# Patient Record
Sex: Male | Born: 1980 | Race: White | Hispanic: No | Marital: Married | Smoking: Current every day smoker
Health system: Southern US, Community
[De-identification: ages and names within clinical notes are randomized; demographics above are authoritative.]

## PROBLEM LIST (undated history)

## (undated) DIAGNOSIS — N200 Calculus of kidney: Secondary | ICD-10-CM

## (undated) HISTORY — PX: ANKLE SURGERY: SHX546

---

## 2001-03-18 ENCOUNTER — Emergency Department (HOSPITAL_COMMUNITY): Admission: EM | Admit: 2001-03-18 | Discharge: 2001-03-18 | Payer: Self-pay | Admitting: Emergency Medicine

## 2001-03-18 ENCOUNTER — Encounter: Payer: Self-pay | Admitting: Emergency Medicine

## 2001-12-14 ENCOUNTER — Emergency Department (HOSPITAL_COMMUNITY): Admission: EM | Admit: 2001-12-14 | Discharge: 2001-12-14 | Payer: Self-pay | Admitting: Emergency Medicine

## 2002-02-26 ENCOUNTER — Emergency Department (HOSPITAL_COMMUNITY): Admission: EM | Admit: 2002-02-26 | Discharge: 2002-02-26 | Payer: Self-pay | Admitting: *Deleted

## 2002-02-26 ENCOUNTER — Encounter: Payer: Self-pay | Admitting: Family Medicine

## 2002-05-01 ENCOUNTER — Encounter: Payer: Self-pay | Admitting: Emergency Medicine

## 2002-05-01 ENCOUNTER — Emergency Department (HOSPITAL_COMMUNITY): Admission: EM | Admit: 2002-05-01 | Discharge: 2002-05-01 | Payer: Self-pay | Admitting: Emergency Medicine

## 2002-08-06 ENCOUNTER — Emergency Department (HOSPITAL_COMMUNITY): Admission: AC | Admit: 2002-08-06 | Discharge: 2002-08-07 | Payer: Self-pay

## 2002-08-06 ENCOUNTER — Encounter: Payer: Self-pay | Admitting: *Deleted

## 2003-10-14 ENCOUNTER — Emergency Department (HOSPITAL_COMMUNITY): Admission: EM | Admit: 2003-10-14 | Discharge: 2003-10-14 | Payer: Self-pay | Admitting: Emergency Medicine

## 2004-02-11 ENCOUNTER — Emergency Department (HOSPITAL_COMMUNITY): Admission: EM | Admit: 2004-02-11 | Discharge: 2004-02-11 | Payer: Self-pay | Admitting: Emergency Medicine

## 2004-07-22 ENCOUNTER — Emergency Department (HOSPITAL_COMMUNITY): Admission: EM | Admit: 2004-07-22 | Discharge: 2004-07-22 | Payer: Self-pay | Admitting: Emergency Medicine

## 2007-01-22 ENCOUNTER — Emergency Department (HOSPITAL_COMMUNITY): Admission: EM | Admit: 2007-01-22 | Discharge: 2007-01-22 | Payer: Self-pay | Admitting: Emergency Medicine

## 2007-05-19 ENCOUNTER — Emergency Department (HOSPITAL_COMMUNITY): Admission: EM | Admit: 2007-05-19 | Discharge: 2007-05-20 | Payer: Self-pay | Admitting: Emergency Medicine

## 2007-09-29 ENCOUNTER — Emergency Department (HOSPITAL_COMMUNITY): Admission: EM | Admit: 2007-09-29 | Discharge: 2007-09-29 | Payer: Self-pay | Admitting: Emergency Medicine

## 2009-02-27 ENCOUNTER — Emergency Department (HOSPITAL_COMMUNITY): Admission: EM | Admit: 2009-02-27 | Discharge: 2009-02-27 | Payer: Self-pay | Admitting: Emergency Medicine

## 2009-03-29 ENCOUNTER — Emergency Department (HOSPITAL_COMMUNITY): Admission: EM | Admit: 2009-03-29 | Discharge: 2009-03-29 | Payer: Self-pay | Admitting: *Deleted

## 2009-04-04 ENCOUNTER — Emergency Department (HOSPITAL_COMMUNITY): Admission: EM | Admit: 2009-04-04 | Discharge: 2009-04-04 | Payer: Self-pay | Admitting: *Deleted

## 2010-04-14 ENCOUNTER — Emergency Department (HOSPITAL_COMMUNITY): Admission: EM | Admit: 2010-04-14 | Discharge: 2010-04-14 | Payer: Self-pay | Admitting: Emergency Medicine

## 2011-02-23 LAB — URINE MICROSCOPIC-ADD ON

## 2011-02-23 LAB — URINALYSIS, ROUTINE W REFLEX MICROSCOPIC
Glucose, UA: NEGATIVE mg/dL
Leukocytes, UA: NEGATIVE
Nitrite: NEGATIVE
Protein, ur: 30 mg/dL — AB
Protein, ur: 30 mg/dL — AB
Specific Gravity, Urine: 1.029 (ref 1.005–1.030)
Urobilinogen, UA: 0.2 mg/dL (ref 0.0–1.0)
Urobilinogen, UA: 1 mg/dL (ref 0.0–1.0)

## 2011-02-23 LAB — POCT I-STAT, CHEM 8
BUN: 19 mg/dL (ref 6–23)
Creatinine, Ser: 1.5 mg/dL (ref 0.4–1.5)
Glucose, Bld: 98 mg/dL (ref 70–99)
Hemoglobin: 16.7 g/dL (ref 13.0–17.0)
TCO2: 25 mmol/L (ref 0–100)

## 2011-02-24 LAB — URINALYSIS, ROUTINE W REFLEX MICROSCOPIC
Nitrite: NEGATIVE
Protein, ur: 100 mg/dL — AB
Specific Gravity, Urine: 1.029 (ref 1.005–1.030)
Urobilinogen, UA: 1 mg/dL (ref 0.0–1.0)

## 2011-02-24 LAB — URINE CULTURE
Colony Count: NO GROWTH
Culture: NO GROWTH

## 2011-08-24 LAB — COMPREHENSIVE METABOLIC PANEL
ALT: 55 — ABNORMAL HIGH
AST: 35
Albumin: 4
Alkaline Phosphatase: 104
CO2: 27
Chloride: 107
Creatinine, Ser: 1.05
GFR calc Af Amer: >60
Potassium: 4
Sodium: 138
Total Bilirubin: 0.6

## 2011-08-24 LAB — URINALYSIS, ROUTINE W REFLEX MICROSCOPIC
Bilirubin Urine: NEGATIVE
Hgb urine dipstick: NEGATIVE
Ketones, ur: NEGATIVE
Specific Gravity, Urine: 1.025
Urobilinogen, UA: 0.2

## 2011-08-24 LAB — CBC
Platelets: 236
RBC: 5.34
WBC: 7.2

## 2011-08-24 LAB — DIFFERENTIAL
Basophils Absolute: 0
Eosinophils Absolute: 0.2
Eosinophils Relative: 3
Lymphocytes Relative: 25
Monocytes Absolute: 0.7

## 2011-08-24 LAB — INFLUENZA A+B VIRUS AG-DIRECT(RAPID)
Inflenza A Ag: NEGATIVE
Influenza B Ag: NEGATIVE

## 2011-08-31 LAB — URINALYSIS, ROUTINE W REFLEX MICROSCOPIC
Nitrite: NEGATIVE
Specific Gravity, Urine: 1.03
Urobilinogen, UA: 0.2

## 2011-08-31 LAB — URINE MICROSCOPIC-ADD ON

## 2011-10-19 ENCOUNTER — Emergency Department (HOSPITAL_COMMUNITY)
Admission: EM | Admit: 2011-10-19 | Discharge: 2011-10-19 | Disposition: A | Discharge status: Home / Self Care | Payer: Self-pay | Attending: Emergency Medicine | Admitting: Emergency Medicine

## 2011-10-19 ENCOUNTER — Emergency Department (HOSPITAL_COMMUNITY): Payer: Self-pay

## 2011-10-19 DIAGNOSIS — F17200 Nicotine dependence, unspecified, uncomplicated: Secondary | ICD-10-CM | POA: Insufficient documentation

## 2011-10-19 DIAGNOSIS — M791 Myalgia, unspecified site: Secondary | ICD-10-CM

## 2011-10-19 DIAGNOSIS — IMO0001 Reserved for inherently not codable concepts without codable children: Secondary | ICD-10-CM | POA: Insufficient documentation

## 2011-10-19 DIAGNOSIS — R062 Wheezing: Secondary | ICD-10-CM | POA: Insufficient documentation

## 2011-10-19 DIAGNOSIS — J40 Bronchitis, not specified as acute or chronic: Secondary | ICD-10-CM | POA: Insufficient documentation

## 2011-10-19 DIAGNOSIS — R112 Nausea with vomiting, unspecified: Secondary | ICD-10-CM | POA: Insufficient documentation

## 2011-10-19 DIAGNOSIS — R509 Fever, unspecified: Secondary | ICD-10-CM | POA: Insufficient documentation

## 2011-10-19 HISTORY — DX: Calculus of kidney: N20.0

## 2011-10-19 MED ORDER — IBUPROFEN 800 MG PO TABS
800.0000 mg | ORAL_TABLET | Freq: Once | ORAL | Status: AC
  Administered 2011-10-19: 800 mg via ORAL
  Filled 2011-10-19: qty 1

## 2011-10-19 MED ORDER — AZITHROMYCIN 250 MG PO TABS: 250.0000 mg | ORAL_TABLET | Freq: Every day | ORAL | Status: AC

## 2011-10-19 MED ORDER — PROMETHAZINE HCL 25 MG PO TABS: 25.0000 mg | ORAL_TABLET | Freq: Four times a day (QID) | ORAL | Status: AC | PRN

## 2011-10-19 MED ORDER — IBUPROFEN 800 MG PO TABS: 800.0000 mg | ORAL_TABLET | Freq: Three times a day (TID) | ORAL | Status: AC

## 2011-10-19 MED ORDER — ONDANSETRON 8 MG PO TBDP
8.0000 mg | ORAL_TABLET | Freq: Once | ORAL | Status: AC
  Administered 2011-10-19: 8 mg via ORAL
  Filled 2011-10-19: qty 1

## 2011-10-19 MED ORDER — PREDNISONE 20 MG PO TABS: 20.0000 mg | ORAL_TABLET | Freq: Every day | ORAL | Status: AC

## 2011-10-19 MED ORDER — ALBUTEROL SULFATE HFA 108 (90 BASE) MCG/ACT IN AERS
2.0000 | INHALATION_SPRAY | Freq: Once | RESPIRATORY_TRACT | Status: AC
  Administered 2011-10-19: 2 via RESPIRATORY_TRACT
  Filled 2011-10-19: qty 6.7

## 2011-10-19 NOTE — ED Notes (Signed)
Spacer given with inhaler.

## 2011-10-19 NOTE — ED Provider Notes (Signed)
History     CSN: [id number] Arrival date & time: 10/19/2011 11:13 AM   First MD Initiated Contact with Patient 10/19/11 1149      Chief Complaint  Patient presents with  . Fever  . Weakness  . Sore Throat  . Nausea  . Emesis   Patient is a 30 y.o. male presenting with fever, weakness, pharyngitis, and vomiting.  Fever Primary symptoms of the febrile illness include fever and vomiting. Primary symptoms do not include fatigue, visual change, headaches, cough, wheezing, shortness of breath, abdominal pain, nausea, dysuria, altered mental status, arthralgias or rash.  Weakness The primary symptoms include fever and vomiting. Primary symptoms do not include headaches, altered mental status, visual change or nausea.  Additional symptoms include weakness. Additional symptoms do not include photophobia.  Sore Throat Associated symptoms include a fever, vomiting and weakness. Pertinent negatives include no abdominal pain, arthralgias, chest pain, chills, coughing, diaphoresis, fatigue, headaches, nausea, neck pain, numbness, rash or visual change.  Emesis  Associated symptoms include a fever. Pertinent negatives include no abdominal pain, no arthralgias, no chills, no cough and no headaches.   Patient presents to the emergency room with complaint of fever, sore throat, cough, and generalized body aches for the past three days. Patient denies any other complaints. Patient reports no difficulty swallowing.   Past Medical History  Diagnosis Date  . Kidney stones     Past Surgical History  Procedure Date  . Ankle surgery     History reviewed. No pertinent family history.  History  Substance Use Topics  . Smoking status: Current Everyday Smoker -- 1.0 packs/day  . Smokeless tobacco: Not on file  . Alcohol Use: No      Review of Systems  Constitutional: Positive for fever. Negative for chills, diaphoresis, appetite change and fatigue.  HENT: Negative for neck pain.   Eyes:  Negative for photophobia and visual disturbance.  Respiratory: Negative for cough, chest tightness, shortness of breath and wheezing.   Cardiovascular: Negative for chest pain.  Gastrointestinal: Positive for vomiting. Negative for nausea and abdominal pain.  Genitourinary: Negative for dysuria and flank pain.  Musculoskeletal: Negative for back pain and arthralgias.  Skin: Negative for rash.  Neurological: Positive for weakness. Negative for numbness and headaches.  Psychiatric/Behavioral: Negative for altered mental status.  All other systems reviewed and are negative.    Allergies  Review of patient's allergies indicates no known allergies.  Home Medications   Current Outpatient Rx  Name Route Sig Dispense Refill  . IBUPROFEN 200 MG PO TABS Oral Take 200 mg by mouth every 6 (six) hours as needed. PAIN     . PSEUDOEPH-DOXYLAMINE-DM-APAP 30-6.25-15-325 MG PO CAPS Oral Take 2 capsules by mouth at bedtime as needed. FOR COLD SYMPTOMS       BP 117/68  Pulse 83  Temp(Src) 99.3 F (37.4 C) (Oral)  Resp 16  SpO2 93%  Physical Exam  Nursing note and vitals reviewed. Constitutional: He is oriented to person, place, and time. He appears well-developed and well-nourished. No distress.  HENT:  Head: Normocephalic and atraumatic.  Eyes: Conjunctivae and EOM are normal. Pupils are equal, round, and reactive to light. Right eye exhibits no discharge. Left eye exhibits no discharge. No scleral icterus.  Neck: Normal range of motion. Neck supple. No JVD present. No tracheal deviation present. No thyromegaly present.  Cardiovascular: Normal rate and regular rhythm.   Pulmonary/Chest: Effort normal. No stridor. No respiratory distress. He has wheezes. He has no rales. He exhibits  no tenderness.  Abdominal: Soft. Bowel sounds are normal. He exhibits no distension and no mass. There is no tenderness. There is no guarding.  Musculoskeletal: Normal range of motion.  Lymphadenopathy:    He has  no cervical adenopathy.  Neurological: He is alert and oriented to person, place, and time.  Skin: Skin is warm and dry. No rash noted. He is not diaphoretic. No erythema. No pallor.  Psychiatric: He has a normal mood and affect. His behavior is normal. Thought content normal.    ED Course  Procedures (including critical care time)  Patient seen and evaluated.  VSS reviewed. . Nursing notes reviewed.  Initial testing ordered. Will monitor the patient closely. They agree with the treatment plan and diagnosis.   Dg Chest 2 View  10/19/2011  *RADIOLOGY REPORT*  Clinical Data: Fever and cough.  CHEST - 2 VIEW  Comparison: Two-view chest 09/29/2007.  Findings: The heart size is normal.  The lungs are clear.  The visualized soft tissues and bony thorax are unremarkable.  IMPRESSION: Negative chest.  Original Report Authenticated By: Jamesetta Orleans. MATTERN, M.D.   Patient seen and re-evaluated. Resting comfortably. VSS stable. NAD. Patient notified of testing results. Stated agreement and understanding. Patient stated understanding to treatment plan and diagnosis.   1:27 PM patient po fluid challenge without problems.     MDM  Bronchitis will treat patient with antibiotics and inhaler, due to the fact that the patient is a smoker, and has had a change in sputum production. Nausea, vomiting         Demetrius Charity, [place] 10/19/11 1328

## 2011-10-19 NOTE — ED Notes (Signed)
Pt presents with presenting complaint x 2 days- fever at home 103.5 oral- unable to eat and drink- OTC meds for relief

## 2011-10-19 NOTE — ED Provider Notes (Signed)
Medical screening examination/treatment/procedure(s) were performed by non-physician practitioner and as supervising physician I was immediately available for consultation/collaboration.  Flint Melter, MD 10/19/11 2041

## 2012-01-19 ENCOUNTER — Encounter (HOSPITAL_COMMUNITY): Payer: Self-pay

## 2012-01-19 ENCOUNTER — Emergency Department (INDEPENDENT_AMBULATORY_CARE_PROVIDER_SITE_OTHER)
Admission: EM | Admit: 2012-01-19 | Discharge: 2012-01-19 | Disposition: A | Discharge status: Home / Self Care | Payer: BC Managed Care – PPO | Source: Home / Self Care

## 2012-01-19 DIAGNOSIS — K648 Other hemorrhoids: Secondary | ICD-10-CM

## 2012-01-19 MED ORDER — HYDROCORTISONE ACETATE 25 MG RE SUPP: 25.0000 mg | Freq: Two times a day (BID) | RECTAL | Status: AC

## 2012-01-19 NOTE — ED Notes (Signed)
Reports painless rectal bleeding for past 8-9 months, getting progressively worse

## 2012-01-19 NOTE — ED Provider Notes (Signed)
History     CSN: [id number]  Arrival date & time 01/19/12  1222   None     Chief Complaint  Patient presents with  . Rectal Bleeding    (Consider location/radiation/quality/duration/timing/severity/associated sxs/prior treatment) HPI Comments: Pt presents with c/o rectal bleeding intermittently x 8-9 months. He states that he notices bright red blood in the toilet bowel after a BM usually twice a week. BMs are regular, and soft normal stool. He denies rectal pain, nor straining with BMs. No FH of colon or rectal problems.     Past Medical History  Diagnosis Date  . Kidney stones     Past Surgical History  Procedure Date  . Ankle surgery     History reviewed. No pertinent family history.  History  Substance Use Topics  . Smoking status: Current Everyday Smoker -- 1.0 packs/day  . Smokeless tobacco: Not on file  . Alcohol Use: No      Review of Systems  Constitutional: Negative for fever, chills and unexpected weight change.  Gastrointestinal: Negative for nausea, vomiting, abdominal pain, diarrhea, constipation and rectal pain.    Allergies  Review of patient's allergies indicates no known allergies.  Home Medications   Current Outpatient Rx  Name Route Sig Dispense Refill  . HYDROCORTISONE ACETATE 25 MG RE SUPP Rectal Place 1 suppository (25 mg total) rectally 2 (two) times daily. 14 suppository 0  . IBUPROFEN 200 MG PO TABS Oral Take 200 mg by mouth every 6 (six) hours as needed. PAIN       BP 104/62  Pulse 58  Temp(Src) 98 F (36.7 C) (Oral)  SpO2 100%  Physical Exam  Nursing note and vitals reviewed. Constitutional: He appears well-developed and well-nourished. No distress.  HENT:  Head: Normocephalic and atraumatic.  Cardiovascular: Normal rate, regular rhythm and normal heart sounds.   Pulmonary/Chest: Effort normal and breath sounds normal.  Abdominal: Soft. Bowel sounds are normal. He exhibits no distension and no mass. There is no tenderness.   Genitourinary: Rectal exam shows internal hemorrhoid. Rectal exam shows no external hemorrhoid (external hemorrhoid tag noted), no fissure, no mass, no tenderness and anal tone normal. Guaiac positive stool. Prostate is not enlarged and not tender.       Anoscope exam reveals one small bleeding internal hemorrhoid.  Neurological: He is alert.  Skin: Skin is warm and dry.  Psychiatric: He has a normal mood and affect.    ED Course  Procedures (including critical care time)  Labs Reviewed - No data to display No results found.   1. Internal hemorrhoid, bleeding       MDM          Melody Comas, PA 01/19/12 1456

## 2012-01-19 NOTE — Discharge Instructions (Signed)
Increase your dietary fiber (eat more fruits, vegetables, and high fiber foods) and drink 6-8 glasses of water a day. Avoid straining with bowel movements and prolonged sitting on the toilet. If your rectal bleeding continues to be recurrent, call Dr Lamar Sprinkles office for follow up.

## 2012-01-24 NOTE — ED Provider Notes (Signed)
Medical screening examination/treatment/procedure(s) were performed by non-physician practitioner and as supervising physician I was immediately available for consultation/collaboration.  Alen Bleacher, MD 01/24/12 2128

## 2013-12-17 ENCOUNTER — Emergency Department (HOSPITAL_COMMUNITY): Payer: BC Managed Care – PPO

## 2013-12-17 ENCOUNTER — Encounter (HOSPITAL_COMMUNITY): Payer: Self-pay | Admitting: Emergency Medicine

## 2013-12-17 ENCOUNTER — Emergency Department (HOSPITAL_COMMUNITY)
Admission: EM | Admit: 2013-12-17 | Discharge: 2013-12-17 | Disposition: A | Discharge status: Home / Self Care | Payer: BC Managed Care – PPO | Attending: Emergency Medicine | Admitting: Emergency Medicine

## 2013-12-17 DIAGNOSIS — S60229A Contusion of unspecified hand, initial encounter: Secondary | ICD-10-CM

## 2013-12-17 DIAGNOSIS — Z87442 Personal history of urinary calculi: Secondary | ICD-10-CM | POA: Insufficient documentation

## 2013-12-17 DIAGNOSIS — F17200 Nicotine dependence, unspecified, uncomplicated: Secondary | ICD-10-CM | POA: Insufficient documentation

## 2013-12-17 DIAGNOSIS — W208XXA Other cause of strike by thrown, projected or falling object, initial encounter: Secondary | ICD-10-CM | POA: Insufficient documentation

## 2013-12-17 DIAGNOSIS — Y9389 Activity, other specified: Secondary | ICD-10-CM | POA: Insufficient documentation

## 2013-12-17 DIAGNOSIS — Z791 Long term (current) use of non-steroidal anti-inflammatories (NSAID): Secondary | ICD-10-CM | POA: Insufficient documentation

## 2013-12-17 DIAGNOSIS — Y929 Unspecified place or not applicable: Secondary | ICD-10-CM | POA: Insufficient documentation

## 2013-12-17 MED ORDER — IBUPROFEN 800 MG PO TABS: 800.0000 mg | ORAL_TABLET | Freq: Three times a day (TID) | ORAL | Status: DC | PRN

## 2013-12-17 MED ORDER — IBUPROFEN 800 MG PO TABS
800.0000 mg | ORAL_TABLET | Freq: Once | ORAL | Status: AC
  Administered 2013-12-17: 800 mg via ORAL
  Filled 2013-12-17: qty 1

## 2013-12-17 NOTE — ED Provider Notes (Signed)
Medical screening examination/treatment/procedure(s) were performed by non-physician practitioner and as supervising physician I was immediately available for consultation/collaboration.  EKG Interpretation   None         Taylan Mayhan S Tamika Nou, MD 12/17/13 1548 

## 2013-12-17 NOTE — ED Provider Notes (Signed)
CSN: [id number]     Arrival date & time 12/17/13  1028 History   First MD Initiated Contact with Patient 12/17/13 1054     Chief Complaint  Patient presents with  . Hand Pain   (Consider location/radiation/quality/duration/timing/severity/associated sxs/prior Treatment) The history is provided by the patient.   Patient presents with left fourth MCP pain. Patient states that 2 days ago he was putting together a swingset in his backyard and one of the poles slipped, falling on his left hand. States that he has taken naproxen 4 at which helped the pain go from a 9/10 intensity to a 7/10 intensity. States it is very sore and the fourth finger is tingly. Denies any other injury. Denies any elbow or wrist pain, weakness or numbness of the fingers. Past Medical History  Diagnosis Date  . Kidney stones    Past Surgical History  Procedure Laterality Date  . Ankle surgery     No family history on file. History  Substance Use Topics  . Smoking status: Current Every Day Smoker -- 1.00 packs/day  . Smokeless tobacco: Not on file  . Alcohol Use: No    Review of Systems  Constitutional: Negative for fever.  Musculoskeletal: Positive for arthralgias.  Skin: Negative for color change, pallor and wound.  Allergic/Immunologic: Negative for immunocompromised state.  Neurological: Negative for weakness and numbness.  Hematological: Does not bruise/bleed easily.    Allergies  Review of patient's allergies indicates no known allergies.  Home Medications   Current Outpatient Rx  Name  Route  Sig  Dispense  Refill  . naproxen sodium (ANAPROX) 220 MG tablet   Oral   Take 440 mg by mouth 2 (two) times daily with a meal.          BP 132/74  Pulse 71  Temp(Src) 97.8 F (36.6 C) (Oral)  Resp 16  SpO2 99% Physical Exam  Nursing note and vitals reviewed. Constitutional: He appears well-developed and well-nourished. No distress.  HENT:  Head: Normocephalic and atraumatic.  Neck: Neck  supple.  Pulmonary/Chest: Effort normal.  Musculoskeletal:       Left wrist: Normal.       Left forearm: Normal.       Left hand: He exhibits decreased range of motion, tenderness and bony tenderness. He exhibits normal capillary refill, no deformity, no laceration and no swelling. Normal sensation noted.       Hands: Patient with slightly decreased range of motion of the left fourth MCP. Otherwise, patient has full active range of motion of all fingers of the left hand  Neurological: He is alert.  Skin: He is not diaphoretic.    ED Course  Procedures (including critical care time) Labs Review Labs Reviewed - No data to display Imaging Review Dg Hand Complete Left  12/17/2013   CLINICAL DATA:  Left fourth MCP tenderness  EXAM: LEFT HAND - COMPLETE 3+ VIEW  COMPARISON:  None.  FINDINGS: There is no evidence of fracture or dislocation. There is no evidence of arthropathy or other focal bone abnormality. Soft tissues are unremarkable.  IMPRESSION: Negative.   Electronically Signed   By: Elige KoHetal  Patel   On: 12/17/2013 11:26    EKG Interpretation   None       MDM   1. Hand contusion     Patient presents with pain to the left fourth MCP after swingset fell on it 2 days ago. He is neurovascularly intact. Xray negative.  Pt d/c home with NSAIDs, RICE treatment. PCP  follow up.  Discussed result, findings, treatment, and follow up  with patient.  Pt given return precautions.  Pt verbalizes understanding and agrees with plan.        Trixie Dredge, PA-C 12/17/13 1257

## 2013-12-17 NOTE — ED Notes (Addendum)
Pt reports putting child toy together and metal part hit him in right joint of the 4th ring finger on left hand on Saturday. Pain at present 7/10. bil strong radial pulses. Able to wiggle fingers on left hand slightly.

## 2013-12-17 NOTE — Discharge Instructions (Signed)
Read the information below.  Use the prescribed medication as directed.  Please discuss all new medications with your pharmacist.  You may return to the Emergency Department at any time for worsening condition or any new symptoms that concern you.  If you develop uncontrolled pain, weakness or numbness of the extremity, severe discoloration of the skin, or you are unable to use your hand, return to the ER for a recheck.     Hand Contusion A hand contusion is a deep bruise on your hand area. Contusions are the result of an injury that caused bleeding under the skin. The contusion may turn blue, purple, or yellow. Minor injuries will give you a painless contusion, but more severe contusions may stay painful and swollen for a few weeks. CAUSES  A contusion is usually caused by a blow, trauma, or direct force to an area of the body. SYMPTOMS   Swelling and redness of the injured area.  Discoloration of the injured area.  Tenderness and soreness of the injured area.  Pain. DIAGNOSIS  The diagnosis can be made by taking a history and performing a physical exam. An X-ray, CT scan, or MRI may be needed to determine if there were any associated injuries, such as broken bones (fractures). TREATMENT  Often, the best treatment for a hand contusion is resting, elevating, icing, and applying cold compresses to the injured area. Over-the-counter medicines may also be recommended for pain control. HOME CARE INSTRUCTIONS   Put ice on the injured area.  Put ice in a plastic bag.  Place a towel between your skin and the bag.  Leave the ice on for 15-20 minutes, 03-04 times a day.  Only take over-the-counter or prescription medicines as directed by your caregiver. Your caregiver may recommend avoiding anti-inflammatory medicines (aspirin, ibuprofen, and naproxen) for 48 hours because these medicines may increase bruising.  If told, use an elastic wrap as directed. This can help reduce swelling. You may  remove the wrap for sleeping, showering, and bathing. If your fingers become numb, cold, or blue, take the wrap off and reapply it more loosely.  Elevate your hand with pillows to reduce swelling.  Avoid overusing your hand if it is painful. SEEK IMMEDIATE MEDICAL CARE IF:   You have increased redness, swelling, or pain in your hand.  Your swelling or pain is not relieved with medicines.  You have loss of feeling in your hand or are unable to move your fingers.  Your hand turns cold or blue.  You have pain when you move your fingers.  Your hand becomes warm to the touch.  Your contusion does not improve in 2 days. MAKE SURE YOU:   Understand these instructions.  Will watch your condition.  Will get help right away if you are not doing well or get worse. Document Released: 04/23/2002 Document Revised: 07/26/2012 Document Reviewed: 04/24/2012 [facility] [place] [facility] [employer] Patient Information 2014 [place], [place].

## 2014-04-19 ENCOUNTER — Emergency Department (HOSPITAL_COMMUNITY)
Admission: EM | Admit: 2014-04-19 | Discharge: 2014-04-19 | Disposition: A | Discharge status: Home / Self Care | Payer: BC Managed Care – PPO | Attending: Emergency Medicine | Admitting: Emergency Medicine

## 2014-04-19 ENCOUNTER — Emergency Department (HOSPITAL_COMMUNITY): Payer: BC Managed Care – PPO

## 2014-04-19 ENCOUNTER — Encounter (HOSPITAL_COMMUNITY): Payer: Self-pay | Admitting: Emergency Medicine

## 2014-04-19 DIAGNOSIS — J029 Acute pharyngitis, unspecified: Secondary | ICD-10-CM | POA: Insufficient documentation

## 2014-04-19 DIAGNOSIS — B9789 Other viral agents as the cause of diseases classified elsewhere: Secondary | ICD-10-CM | POA: Insufficient documentation

## 2014-04-19 DIAGNOSIS — R197 Diarrhea, unspecified: Secondary | ICD-10-CM | POA: Insufficient documentation

## 2014-04-19 DIAGNOSIS — Z87442 Personal history of urinary calculi: Secondary | ICD-10-CM | POA: Insufficient documentation

## 2014-04-19 DIAGNOSIS — B349 Viral infection, unspecified: Secondary | ICD-10-CM

## 2014-04-19 DIAGNOSIS — F17200 Nicotine dependence, unspecified, uncomplicated: Secondary | ICD-10-CM | POA: Insufficient documentation

## 2014-04-19 DIAGNOSIS — R112 Nausea with vomiting, unspecified: Secondary | ICD-10-CM | POA: Insufficient documentation

## 2014-04-19 LAB — I-STAT CHEM 8, ED
BUN: 10 mg/dL (ref 6–23)
CALCIUM ION: 1.12 mmol/L (ref 1.12–1.23)
Chloride: 99 mEq/L (ref 96–112)
Creatinine, Ser: 1 mg/dL (ref 0.50–1.35)
GLUCOSE: 107 mg/dL — AB (ref 70–99)
HEMATOCRIT: 48 % (ref 39.0–52.0)
HEMOGLOBIN: 16.3 g/dL (ref 13.0–17.0)
POTASSIUM: 3.8 meq/L (ref 3.7–5.3)
Sodium: 139 mEq/L (ref 137–147)
TCO2: 23 mmol/L (ref 0–100)

## 2014-04-19 MED ORDER — ONDANSETRON 4 MG PO TBDP
4.0000 mg | ORAL_TABLET | Freq: Once | ORAL | Status: AC
  Administered 2014-04-19: 4 mg via ORAL
  Filled 2014-04-19: qty 1

## 2014-04-19 MED ORDER — BENZONATATE 100 MG PO CAPS: 100.0000 mg | ORAL_CAPSULE | Freq: Three times a day (TID) | ORAL | Status: DC | PRN

## 2014-04-19 MED ORDER — ONDANSETRON 4 MG PO TBDP: ORAL_TABLET | ORAL | Status: DC

## 2014-04-19 MED ORDER — ACETAMINOPHEN 500 MG PO TABS
1000.0000 mg | ORAL_TABLET | Freq: Once | ORAL | Status: AC
  Administered 2014-04-19: 1000 mg via ORAL
  Filled 2014-04-19: qty 2

## 2014-04-19 NOTE — ED Provider Notes (Signed)
CSN: [id number]     Arrival date & time 04/19/14  0803 History   First MD Initiated Contact with Patient 04/19/14 0820     Chief Complaint  Patient presents with  . cough with dizziness    . Shortness of Breath  . Emesis  . Sore Throat     (Consider location/radiation/quality/duration/timing/severity/associated sxs/prior Treatment) HPI 33 year old male presents with 3 days of sore throat and cough. He states that he developed nausea, vomiting, and diarrhea over the last couple days. He was at work and vomited and they sent him home. So he came to the emergency department for evaluation. He denies any abdominal pain. He does get chest pain when coughing but otherwise has no chest pain. He occasionally gets dizzy while he is acutely coughing but otherwise has no dizziness at rest or with standing. Developed a fever up to 100.3 last night. He's tried NyQuil without any relief. Denies any neck stiffness or headaches. Feels like his chest is congested but denies any rhinorrhea or nasal congestion. No abdominal pain. States he feels shortness of breath while in active coughing but otherwise not short of breath. He does smoke cigarettes.  Past Medical History  Diagnosis Date  . Kidney stones    Past Surgical History  Procedure Laterality Date  . Ankle surgery     History reviewed. No pertinent family history. History  Substance Use Topics  . Smoking status: Current Every Day Smoker -- 1.00 packs/day  . Smokeless tobacco: Not on file  . Alcohol Use: No    Review of Systems  Constitutional: Positive for fever.  HENT: Positive for congestion, rhinorrhea and sore throat.   Respiratory: Positive for cough and shortness of breath.   Cardiovascular: Positive for chest pain.  Gastrointestinal: Positive for nausea, vomiting and diarrhea. Negative for abdominal pain and blood in stool.  Musculoskeletal: Negative for neck stiffness.  Neurological: Negative for headaches.  All other systems  reviewed and are negative.     Allergies  Review of patient's allergies indicates no known allergies.  Home Medications   Prior to Admission medications   Medication Sig Start Date End Date Taking? Authorizing Provider  naproxen sodium (ANAPROX) 220 MG tablet Take 220 mg by mouth as needed (for pain).   Yes Historical Provider, MD  Pseudoeph-Doxylamine-DM-APAP (NYQUIL PO) Take 15-30 mLs by mouth at bedtime as needed (cold symptoms).   Yes Historical Provider, MD  Pseudoephedrine-APAP-DM (DAYQUIL PO) Take 2 capsules by mouth every 6 (six) hours as needed (cold symptoms).   Yes Historical Provider, MD   BP 117/74  Pulse 95  Temp(Src) 100 F (37.8 C) (Oral)  Resp 16  SpO2 96% Physical Exam  Nursing note and vitals reviewed. Constitutional: He is oriented to person, place, and time. He appears well-developed and well-nourished.  HENT:  Head: Normocephalic and atraumatic.  Right Ear: External ear normal.  Left Ear: External ear normal.  Nose: Nose normal.  Mouth/Throat: Uvula is midline and mucous membranes are normal. No trismus in the jaw. Posterior oropharyngeal erythema present. No oropharyngeal exudate, posterior oropharyngeal edema or tonsillar abscesses.  Eyes: Right eye exhibits no discharge. Left eye exhibits no discharge.  Neck: Normal range of motion. Neck supple.  Cardiovascular: Normal rate, regular rhythm, normal heart sounds and intact distal pulses.   Pulmonary/Chest: Effort normal and breath sounds normal. He has no rales.  Occasional faint wheezing on left side initially, then lungs cleared  Abdominal: Soft. He exhibits no distension. There is no tenderness.  Musculoskeletal: He  exhibits no edema.  Lymphadenopathy:    He has no cervical adenopathy.  Neurological: He is alert and oriented to person, place, and time.  Skin: Skin is warm and dry.    ED Course  Procedures (including critical care time) Labs Review Labs Reviewed  I-STAT CHEM 8, ED - Abnormal;  Notable for the following:    Glucose, Bld 107 (*)    All other components within normal limits    Imaging Review Dg Chest 2 View  04/19/2014   CLINICAL DATA:  Shortness of breath and cough  EXAM: CHEST  2 VIEW  COMPARISON:  10/19/2011  FINDINGS: The heart size and mediastinal contours are within normal limits. Both lungs are clear. The visualized skeletal structures are unremarkable.  IMPRESSION: No active cardiopulmonary disease.   Electronically Signed   By: Alcide CleverMark  Lukens M.D.   On: 04/19/2014 09:06     EKG Interpretation   Date/Time:  Friday April 19 2014 08:23:39 EDT Ventricular Rate:  86 PR Interval:  156 QRS Duration: 91 QT Interval:  355 QTC Calculation: 425 R Axis:   97 Text Interpretation:  Sinus rhythm Borderline right axis deviation  Baseline wander in lead(s) V2 Otherwise within normal limits No old  tracing to compare Confirmed by Treysen Sudbeck  MD, Kiya Eno ([id number]) on 04/19/2014  8:28:06 AM      MDM   Final diagnoses:  Viral syndrome    Patient's chest pain appears to be related to cough, has benign EKG and in setting of infection this unlikely ACS, PE or dissection. No abdominal tenderness. Chest xray shows no pneumonia or other concerning findings. . Sore throat is likely a part of his viral syndrome, has 0 points on Centor criteria, strep testing not indicated. Given zofran, tylenol and tolerated oral fluids. Will d/c with cough suppressant, zofran and NSAIDs/Tylenol prn. Counseled on smoking cessation. Stable for discharge.    Audree CamelScott T Zong Mcquarrie, MD 04/19/14 [phone]

## 2014-04-19 NOTE — Progress Notes (Signed)
[facility] spoke with patient about community resources. Patient stated that he had [facility] and a pcp.

## 2014-04-19 NOTE — ED Notes (Addendum)
Pt reports sore throat/cough over past few days with new onset of CP and SOB with cough starting yesterday. Pt reports episode of vomiting with current nausea at present time.

## 2014-04-19 NOTE — ED Notes (Addendum)
Pt reports non productive cough and sore throat x3 days, 6/10, in last two days pt has had vomiting and diarrhea. Now chest pain when he coughs and dizziness. Reports SOB started yesterday. Pt reports fever yesterday 100.3. Febrile at present.  OTC have provided no relief.

## 2014-04-19 NOTE — ED Notes (Signed)
Pt transported to xray 

## 2015-08-13 ENCOUNTER — Emergency Department (HOSPITAL_COMMUNITY)
Admission: EM | Admit: 2015-08-13 | Discharge: 2015-08-13 | Disposition: A | Discharge status: Home / Self Care | Payer: Commercial Managed Care - HMO | Attending: Emergency Medicine | Admitting: Emergency Medicine

## 2015-08-13 ENCOUNTER — Encounter (HOSPITAL_COMMUNITY): Payer: Self-pay | Admitting: Emergency Medicine

## 2015-08-13 DIAGNOSIS — R0981 Nasal congestion: Secondary | ICD-10-CM | POA: Diagnosis present

## 2015-08-13 DIAGNOSIS — R509 Fever, unspecified: Secondary | ICD-10-CM | POA: Diagnosis not present

## 2015-08-13 DIAGNOSIS — R51 Headache: Secondary | ICD-10-CM | POA: Insufficient documentation

## 2015-08-13 DIAGNOSIS — R6889 Other general symptoms and signs: Secondary | ICD-10-CM

## 2015-08-13 DIAGNOSIS — Z720 Tobacco use: Secondary | ICD-10-CM | POA: Insufficient documentation

## 2015-08-13 DIAGNOSIS — R05 Cough: Secondary | ICD-10-CM | POA: Diagnosis not present

## 2015-08-13 DIAGNOSIS — Z79899 Other long term (current) drug therapy: Secondary | ICD-10-CM | POA: Diagnosis not present

## 2015-08-13 DIAGNOSIS — J029 Acute pharyngitis, unspecified: Secondary | ICD-10-CM | POA: Insufficient documentation

## 2015-08-13 DIAGNOSIS — Z87442 Personal history of urinary calculi: Secondary | ICD-10-CM | POA: Insufficient documentation

## 2015-08-13 LAB — RAPID STREP SCREEN (MED CTR MEBANE ONLY): STREPTOCOCCUS, GROUP A SCREEN (DIRECT): NEGATIVE

## 2015-08-13 MED ORDER — PREDNISONE 20 MG PO TABS: 40.0000 mg | ORAL_TABLET | Freq: Every day | ORAL | Status: DC

## 2015-08-13 MED ORDER — HYDROCODONE-HOMATROPINE 5-1.5 MG/5ML PO SYRP: 5.0000 mL | ORAL_SOLUTION | Freq: Four times a day (QID) | ORAL | Status: DC | PRN

## 2015-08-13 MED ORDER — OSELTAMIVIR PHOSPHATE 75 MG PO CAPS: 75.0000 mg | ORAL_CAPSULE | Freq: Two times a day (BID) | ORAL | Status: DC

## 2015-08-13 MED ORDER — ALBUTEROL SULFATE HFA 108 (90 BASE) MCG/ACT IN AERS: 1.0000 | INHALATION_SPRAY | Freq: Four times a day (QID) | RESPIRATORY_TRACT | Status: DC | PRN

## 2015-08-13 NOTE — Discharge Instructions (Signed)

## 2015-08-13 NOTE — ED Provider Notes (Signed)
CSN: [id number]     Arrival date & time 08/13/15  0911 History   First MD Initiated Contact with Patient 08/13/15 1005     Chief Complaint  Patient presents with  . URI     (Consider location/radiation/quality/duration/timing/severity/associated sxs/prior Treatment) HPI   Chad Martin is a 34 y.o. male who complains of congestion, sore throat, productive cough, myalgias, headache and fever for 2 days. He denies a history of anorexia, chest pain, dizziness and weakness and denies a history of asthma. Patient admits to smoke cigarettes. . Denies DOE, , chest tightness or pressure, radiation to left arm, jaw or back, or diaphoresis. Denies dysuria, flank pain, suprapubic pain, frequency, urgency, or hematuria. Denies headaches, light headedness, weakness, visual disturbances. Denies abdominal pain,diarrhea or constipation.       Past Medical History  Diagnosis Date  . Kidney stones    Past Surgical History  Procedure Laterality Date  . Ankle surgery     No family history on file. Social History  Substance Use Topics  . Smoking status: Current Every Day Smoker -- 1.00 packs/day  . Smokeless tobacco: None  . Alcohol Use: No    Review of Systems  Ten systems reviewed and are negative for acute change, except as noted in the HPI.    Allergies  Review of patient's allergies indicates no known allergies.  Home Medications   Prior to Admission medications   Medication Sig Start Date End Date Taking? Authorizing Provider  ibuprofen (ADVIL,MOTRIN) 200 MG tablet Take 400 mg by mouth every 6 (six) hours as needed for headache, mild pain or moderate pain.   Yes Historical Provider, MD  Multiple Vitamins-Minerals (MULTIVITAMIN ADULT PO) Take 1 tablet by mouth daily.   Yes Historical Provider, MD  Pseudoeph-Doxylamine-DM-APAP (NYQUIL PO) Take 15-30 mLs by mouth at bedtime as needed (cold symptoms).   Yes Historical Provider, MD  albuterol (PROVENTIL HFA;VENTOLIN HFA) 108 (90 BASE)  MCG/ACT inhaler Inhale 1-2 puffs into the lungs every 6 (six) hours as needed for wheezing or shortness of breath. 08/13/15   Calistro Rauf, PA-C  HYDROcodone-homatropine (HYCODAN) 5-1.5 MG/5ML syrup Take 5 mLs by mouth every 6 (six) hours as needed for cough. 08/13/15   Arthor Captain, PA-C  oseltamivir (TAMIFLU) 75 MG capsule Take 1 capsule (75 mg total) by mouth every 12 (twelve) hours. 08/13/15   Arthor Captain, PA-C  predniSONE (DELTASONE) 20 MG tablet Take 2 tablets (40 mg total) by mouth daily. 08/13/15   Letecia Arps, PA-C   BP 101/74 mmHg  Pulse 73  Temp(Src) 97.9 F (36.6 C) (Oral)  Resp 18  SpO2 94% Physical Exam Appears moderately ill but not toxic; temperature as noted in vitals. Ears normal. Eyes:glassy appearance, no discharge  Heart: RRR, NO M/G/R Throat and pharynx erythematous without exudate. Neck supple. Mild Cervical adenopathyhy in the neck.  Sinuses non tender.  The chest with ronchi that clear with cough and mild expiratory wheeze. Abdomen is soft and nontender  ED Course  Procedures (including critical care time) Labs Review Labs Reviewed  RAPID STREP SCREEN (NOT AT [facility])  CULTURE, GROUP A STREP    Imaging Review No results found. I have personally reviewed and evaluated these images and lab results as part of my medical decision-making.   EKG Interpretation None      MDM   Final diagnoses:  Flu-like symptoms    11:32 AM BP 101/74 mmHg  Pulse 73  Temp(Src) 97.9 F (36.6 C) (Oral)  Resp 18  SpO2 94%  Patient with flu like illnes. Tmax 101 at home. D/c with tamiflu and sxs treatment. Work note.    Arthor Captain, PA-C 08/13/15 1132  Pricilla Loveless, MD 08/14/15 [phone]

## 2015-08-13 NOTE — ED Notes (Signed)
Per pt, states cold symptoms since Sunday-cough, sore throat

## 2015-08-15 LAB — CULTURE, GROUP A STREP: STREP A CULTURE: NEGATIVE

## 2019-12-18 ENCOUNTER — Emergency Department (HOSPITAL_COMMUNITY): Payer: Self-pay

## 2019-12-18 ENCOUNTER — Other Ambulatory Visit: Payer: Self-pay

## 2019-12-18 ENCOUNTER — Encounter (HOSPITAL_COMMUNITY): Payer: Self-pay | Admitting: Emergency Medicine

## 2019-12-18 ENCOUNTER — Emergency Department (HOSPITAL_COMMUNITY)
Admission: EM | Admit: 2019-12-18 | Discharge: 2019-12-18 | Disposition: A | Discharge status: Home / Self Care | Payer: Self-pay | Attending: Emergency Medicine | Admitting: Emergency Medicine

## 2019-12-18 DIAGNOSIS — Z87442 Personal history of urinary calculi: Secondary | ICD-10-CM | POA: Insufficient documentation

## 2019-12-18 DIAGNOSIS — M545 Low back pain, unspecified: Secondary | ICD-10-CM

## 2019-12-18 DIAGNOSIS — R1032 Left lower quadrant pain: Secondary | ICD-10-CM | POA: Insufficient documentation

## 2019-12-18 DIAGNOSIS — Z79899 Other long term (current) drug therapy: Secondary | ICD-10-CM | POA: Insufficient documentation

## 2019-12-18 DIAGNOSIS — F17210 Nicotine dependence, cigarettes, uncomplicated: Secondary | ICD-10-CM | POA: Insufficient documentation

## 2019-12-18 LAB — URINALYSIS, ROUTINE W REFLEX MICROSCOPIC
Bacteria, UA: NONE SEEN
Bilirubin Urine: NEGATIVE
Glucose, UA: NEGATIVE mg/dL
Hgb urine dipstick: NEGATIVE
Ketones, ur: NEGATIVE mg/dL
Leukocytes,Ua: NEGATIVE
Nitrite: NEGATIVE
Protein, ur: NEGATIVE mg/dL
Specific Gravity, Urine: 1.014 (ref 1.005–1.030)
pH: 7 (ref 5.0–8.0)

## 2019-12-18 LAB — CBC WITH DIFFERENTIAL/PLATELET
Abs Immature Granulocytes: 0.03 10*3/uL (ref 0.00–0.07)
Basophils Absolute: 0.1 10*3/uL (ref 0.0–0.1)
Basophils Relative: 1 %
Eosinophils Absolute: 0.3 10*3/uL (ref 0.0–0.5)
Eosinophils Relative: 5 %
HCT: 50.4 % (ref 39.0–52.0)
Hemoglobin: 17.3 g/dL — ABNORMAL HIGH (ref 13.0–17.0)
Immature Granulocytes: 0 %
Lymphocytes Relative: 37 %
Lymphs Abs: 2.8 10*3/uL (ref 0.7–4.0)
MCH: 30.7 pg (ref 26.0–34.0)
MCHC: 34.3 g/dL (ref 30.0–36.0)
MCV: 89.4 fL (ref 80.0–100.0)
Monocytes Absolute: 0.4 10*3/uL (ref 0.1–1.0)
Monocytes Relative: 5 %
Neutro Abs: 3.8 10*3/uL (ref 1.7–7.7)
Neutrophils Relative %: 52 %
Platelets: 274 10*3/uL (ref 150–400)
RBC: 5.64 MIL/uL (ref 4.22–5.81)
RDW: 12.8 % (ref 11.5–15.5)
WBC: 7.5 10*3/uL (ref 4.0–10.5)
nRBC: 0 % (ref 0.0–0.2)

## 2019-12-18 LAB — BASIC METABOLIC PANEL
Anion gap: 12 (ref 5–15)
BUN: 11 mg/dL (ref 6–20)
CO2: 26 mmol/L (ref 22–32)
Calcium: 9.3 mg/dL (ref 8.9–10.3)
Chloride: 103 mmol/L (ref 98–111)
Creatinine, Ser: 1.03 mg/dL (ref 0.61–1.24)
GFR calc Af Amer: >60 mL/min (ref >60)
GFR calc non Af Amer: >60 mL/min (ref >60)
Glucose, Bld: 96 mg/dL (ref 70–99)
Potassium: 4.3 mmol/L (ref 3.5–5.1)
Sodium: 141 mmol/L (ref 135–145)

## 2019-12-18 MED ORDER — CYCLOBENZAPRINE HCL 10 MG PO TABS
5.0000 mg | ORAL_TABLET | Freq: Once | ORAL | Status: AC
  Administered 2019-12-18: 5 mg via ORAL
  Filled 2019-12-18: qty 1

## 2019-12-18 MED ORDER — NAPROXEN 500 MG PO TABS: 500.0000 mg | ORAL_TABLET | Freq: Two times a day (BID) | ORAL | 0 refills | Status: DC

## 2019-12-18 MED ORDER — METHOCARBAMOL 500 MG PO TABS: 500.0000 mg | ORAL_TABLET | Freq: Two times a day (BID) | ORAL | 0 refills | Status: DC

## 2019-12-18 MED ORDER — KETOROLAC TROMETHAMINE 60 MG/2ML IM SOLN
60.0000 mg | Freq: Once | INTRAMUSCULAR | Status: AC
  Administered 2019-12-18: 60 mg via INTRAMUSCULAR
  Filled 2019-12-18: qty 2

## 2019-12-18 NOTE — ED Provider Notes (Signed)
[facility] EMERGENCY DEPARTMENT Provider Note   CSN: [id number] Arrival date & time: 12/18/19  1056     History Chief Complaint  Patient presents with  . Flank Pain  . Back Pain    Chad Martin is a 39 y.o. male.  Patient is a 39 year old male with past medical history of kidney stones presenting to the emergency department for left-sided back pain.  Patient reports that he has left lower back pain which started this morning.  Denies any injury or trauma.  Reports that it feels a little bit different from when he had a kidney stone because he is not having any urinary symptoms at this time.  He denies any dysuria, hematuria, abdominal pain, nausea, vomiting, diarrhea, fever.  The pain is worse with movement.  He has not tried anything for relief.  Denies any numbness, tingling, saddle anesthesia, loss of control of bowel or bladder movements.  He reports 1 instances of coughing up streaky blood over the weekend but since then has had no cough or URI symptoms.  He attributes this to a "smoker's cough"        Past Medical History:  Diagnosis Date  . Kidney stones     There are no problems to display for this patient.   Past Surgical History:  Procedure Laterality Date  . ANKLE SURGERY         No family history on file.  Social History   Tobacco Use  . Smoking status: Current Every Day Smoker    Packs/day: 1.00  . Smokeless tobacco: Never Used  Substance Use Topics  . Alcohol use: No  . Drug use: No    Home Medications Prior to Admission medications   Medication Sig Start Date End Date Taking? Authorizing Provider  albuterol (PROVENTIL HFA;VENTOLIN HFA) 108 (90 BASE) MCG/ACT inhaler Inhale 1-2 puffs into the lungs every 6 (six) hours as needed for wheezing or shortness of breath. 08/13/15   Harris, Abigail, PA-C  HYDROcodone-homatropine [facility]) 5-1.5 MG/5ML syrup Take 5 mLs by mouth every 6 (six) hours as needed for cough. 08/13/15   Arthor Captain, PA-C  ibuprofen (ADVIL,MOTRIN) 200 MG tablet Take 400 mg by mouth every 6 (six) hours as needed for headache, mild pain or moderate pain.    [provider]  methocarbamol (ROBAXIN) 500 MG tablet Take 1 tablet (500 mg total) by mouth 2 (two) times daily. 12/18/19   Arlyn Dunning, PA-C  Multiple Vitamins-Minerals (MULTIVITAMIN ADULT PO) Take 1 tablet by mouth daily.    [provider]  naproxen (NAPROSYN) 500 MG tablet Take 1 tablet (500 mg total) by mouth 2 (two) times daily. 12/18/19   Arlyn Dunning, PA-C  oseltamivir (TAMIFLU) 75 MG capsule Take 1 capsule (75 mg total) by mouth every 12 (twelve) hours. 08/13/15   Harris, Cammy Copa, PA-C  predniSONE (DELTASONE) 20 MG tablet Take 2 tablets (40 mg total) by mouth daily. 08/13/15   Harris, Abigail, PA-C  Pseudoeph-Doxylamine-DM-APAP (NYQUIL PO) Take 15-30 mLs by mouth at bedtime as needed (cold symptoms).    [provider]    Allergies    Patient has no known allergies.  Review of Systems   Review of Systems  Constitutional: Negative for appetite change and fever.  HENT: Negative for congestion, rhinorrhea and sore throat.   Respiratory: Negative for cough and shortness of breath.   Cardiovascular: Negative for chest pain.  Gastrointestinal: Negative for abdominal pain, nausea and vomiting.  Genitourinary: Positive for flank pain.  Negative for dysuria, frequency and testicular pain.  Musculoskeletal: Positive for back pain and myalgias. Negative for arthralgias and neck pain.  Skin: Negative for rash and wound.  Allergic/Immunologic: Negative for immunocompromised state.  Neurological: Negative for dizziness, light-headedness and headaches.  Hematological: Does not bruise/bleed easily.    Physical Exam Updated Vital Signs BP 114/75 (BP Location: Right Arm)   Pulse 63   Temp 97.7 F (36.5 C) (Oral)   Resp 16   Wt 77.1 kg   SpO2 98%   Physical Exam Vitals and nursing note reviewed.  Constitutional:       General: He is not in acute distress.    Appearance: Normal appearance. He is not ill-appearing, toxic-appearing or diaphoretic.  HENT:     Head: Normocephalic.     Mouth/Throat:     Mouth: Mucous membranes are moist.  Eyes:     Conjunctiva/sclera: Conjunctivae normal.  Cardiovascular:     Rate and Rhythm: Normal rate.  Pulmonary:     Effort: Pulmonary effort is normal.  Abdominal:     General: Abdomen is flat.     Tenderness: There is no abdominal tenderness. There is no right CVA tenderness, left CVA tenderness, guarding or rebound.  Musculoskeletal:       Back:  Skin:    General: Skin is dry.  Neurological:     Mental Status: He is alert.     Sensory: No sensory deficit.     Motor: No weakness.     Gait: Gait normal.  Psychiatric:        Mood and Affect: Mood normal.     ED Results / Procedures / Treatments   Labs (all labs ordered are listed, but only abnormal results are displayed) Labs Reviewed  CBC WITH DIFFERENTIAL/PLATELET - Abnormal; Notable for the following components:      Result Value   Hemoglobin 17.3 (*)    All other components within normal limits  URINALYSIS, ROUTINE W REFLEX MICROSCOPIC  BASIC METABOLIC PANEL    EKG None  Radiology US Renal  Result Date: 12/18/2019 CLINICAL DATA:  Left-sided abdominal pain, history of renal calculi EXAM: RENAL / URINARY TRACT ULTRASOUND COMPLETE COMPARISON:  03/29/2009 FINDINGS: Right Kidney: Renal measurements: 12.1 x 4.2 by 4.9 cm = volume: 130 mL . Echogenicity within normal limits. No mass or hydronephrosis visualized. Left Kidney: Renal measurements: 12.5 x 5.5 x 4.6 cm = volume: 164 mL. Echogenicity within normal limits. No mass or hydronephrosis visualized. Bladder: Appears normal for degree of bladder distention. Other: No evidence of urinary tract calculi. Prostate is grossly normal in size. IMPRESSION: 1. Unremarkable renal ultrasound. No evidence of urinary tract calculi or obstructive uropathy.  Electronically Signed   By: Sharlet Salina M.D.   On: 12/18/2019 13:50    Procedures Procedures (including critical care time)  Medications Ordered in ED Medications  cyclobenzaprine (FLEXERIL) tablet 5 mg (5 mg Oral Given 12/18/19 1351)  ketorolac (TORADOL) injection 60 mg (60 mg Intramuscular Given 12/18/19 1351)    ED Course  I have reviewed the triage vital signs and the nursing notes.  Pertinent labs & imaging results that were available during my care of the patient were reviewed by me and considered in my medical decision making (see chart for details).  Clinical Course as of Dec 17 1502  Tue Dec 18, 2019  1201 Patient presenting with left lower back pain since this morning.  Atraumatic.  Patient has no signs or symptoms of cauda equina and no red flag  symptoms.  He does have a history of kidney stones and he is concerned this may be kidney stone.  He is not having any urinary symptoms.  A renal ultrasound, urinalysis and labs were obtained.   [KM]  0177 Patient's work-up is reassuring including a negative urine, BMP and CBC.  His renal ultrasound was also normal.  He will be treated for musculoskeletal back pain.  He was improved with cyclobenzaprine and Toradol here in the emergency department and was seen up and walking without any signs of pain here in the ED as well.   [KM]    Clinical Course User Index [KM] Kristine Royal   MDM Rules/Calculators/A&P                      Based on review of vitals, medical screening exam, lab work and/or imaging, there does not appear to be an acute, emergent etiology for the patient's symptoms. Counseled pt on good return precautions and encouraged both PCP and ED follow-up as needed.  Prior to discharge, I also discussed incidental imaging findings with patient in detail and advised appropriate, recommended follow-up in detail.  Clinical Impression: 1. Acute left-sided low back pain without sciatica     Disposition:  Discharge  Prior to providing a prescription for a controlled substance, I independently reviewed the patient's recent prescription history on the [place]. The patient had no recent or regular prescriptions and was deemed appropriate for a brief, less than 3 day prescription of narcotic for acute analgesia.  This note was prepared with assistance of Systems analyst. Occasional wrong-word or sound-a-like substitutions may have occurred due to the inherent limitations of voice recognition software.  Final Clinical Impression(s) / ED Diagnoses Final diagnoses:  Acute left-sided low back pain without sciatica    Rx / DC Orders ED Discharge Orders         Ordered    methocarbamol (ROBAXIN) 500 MG tablet  2 times daily     12/18/19 1504    naproxen (NAPROSYN) 500 MG tablet  2 times daily     12/18/19 1504           Kristine Royal 12/18/19 1504    Maudie Flakes, MD 12/21/19 [phone]

## 2019-12-18 NOTE — ED Notes (Signed)
Pt taken to US

## 2019-12-18 NOTE — ED Triage Notes (Signed)
Pt in with L back/flank pain. Tender on palpation, has hx of kidney stones. Also states he coughed up blood 4 days ago, denies any cough, sob or fevers

## 2019-12-18 NOTE — Discharge Instructions (Signed)
You were seen todafor back/flank pain. Your labs and kidney studies were normal. We think you have sprained or pulled a muscle and we have prescribed some medication for this.  Please be advised that the muscle relaxer may make you sleepy or drowsy and you should not drive or operate heavy machinery while taking this medication. Thank you for allowing me to care for you today. Please return to the emergency department if you have new or worsening symptoms. Take your medications as instructed.

## 2019-12-18 NOTE — ED Notes (Signed)
Pt discharge and prescription education provided. Pt verbalizes understanding. Pt is alert and oriented x 4 and ambulatory at discharge.  

## 2020-08-20 IMAGING — US US RENAL
1 series · 14 of 25 positions shown · non-contrast
Comparison: 03/29/2009

CLINICAL DATA: Left-sided abdominal pain, history of renal calculi

EXAM:
RENAL / URINARY TRACT ULTRASOUND COMPLETE

[Series 1: us renal · 14 of 41 slices shown]
[im 1/41]
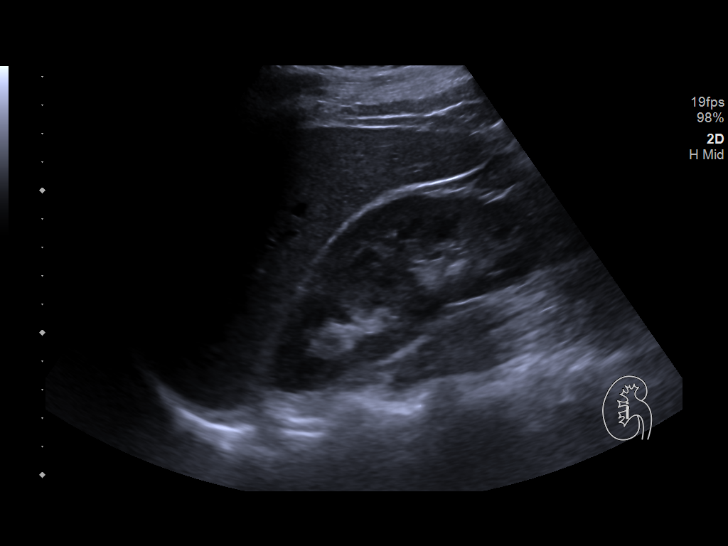
[im 4/41]
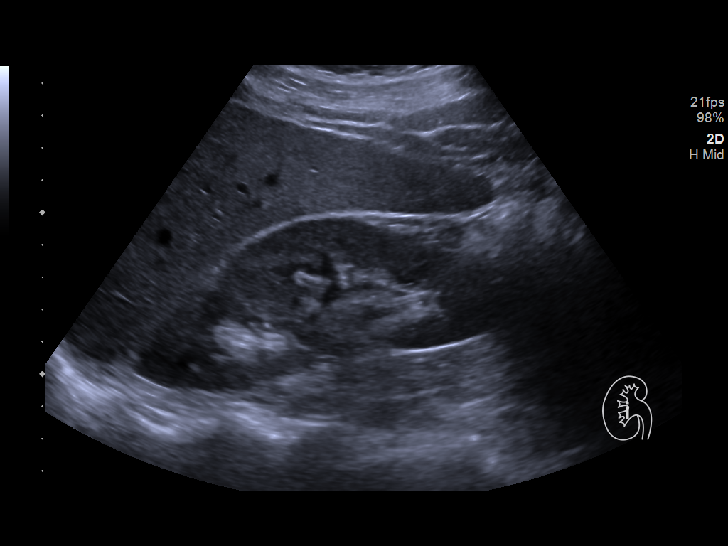
[im 7/41]
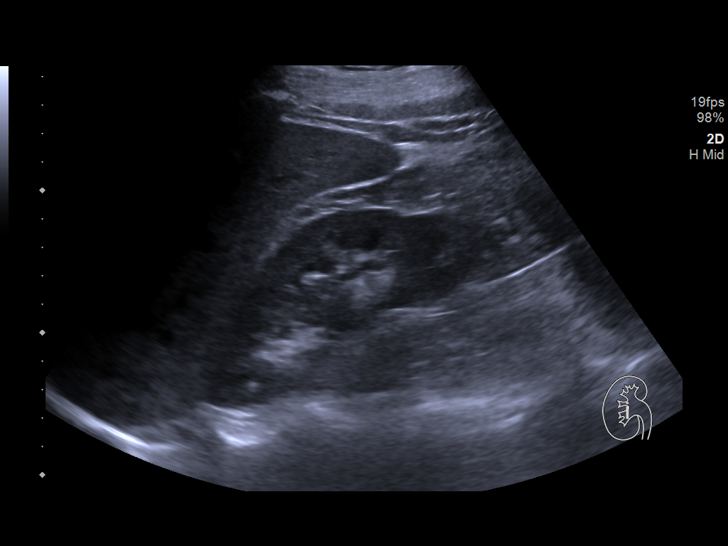
[im 11/41]
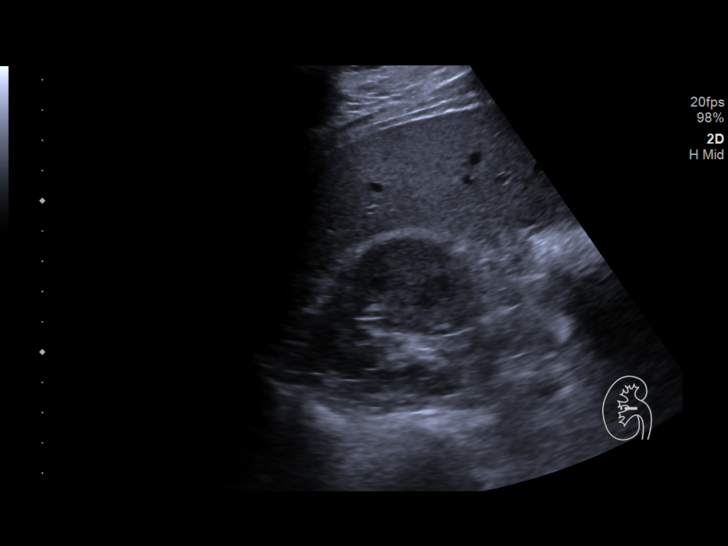
[im 14/41]
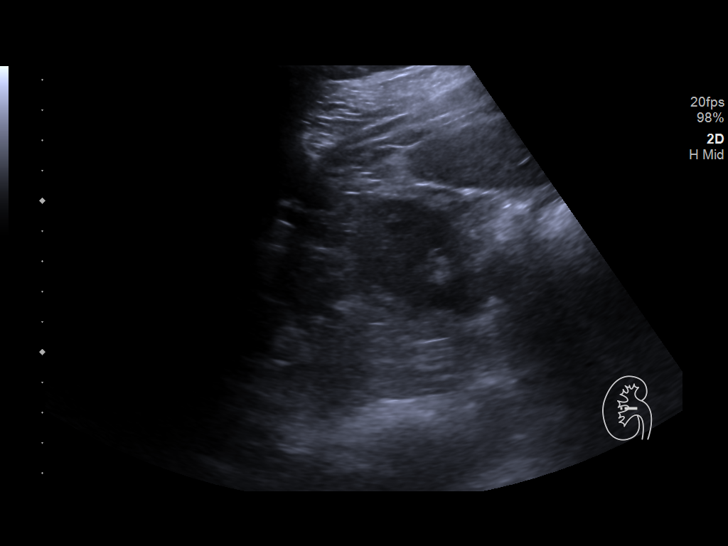
[im 16/41]
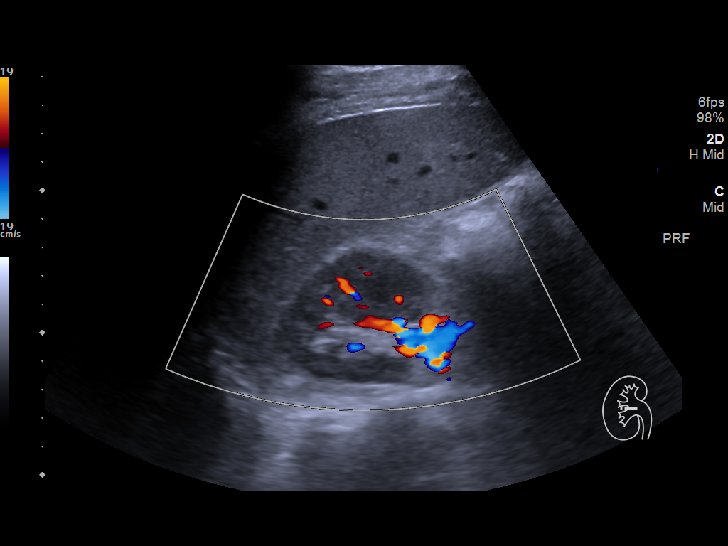
[im 19/41]
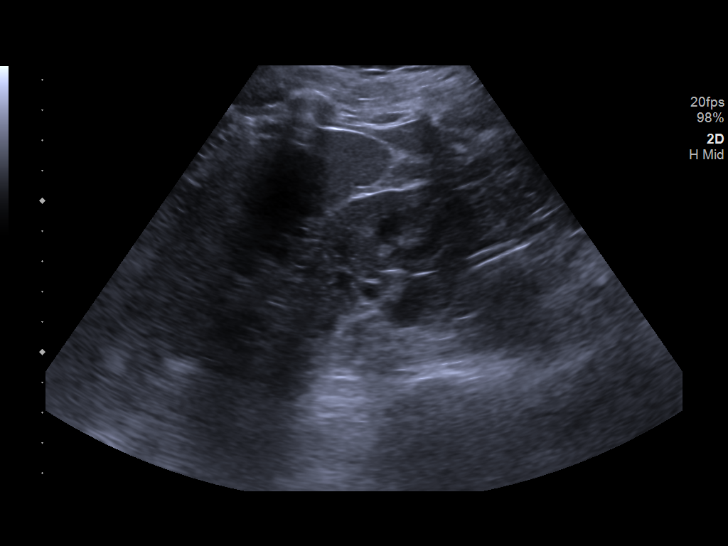
[im 22/41]
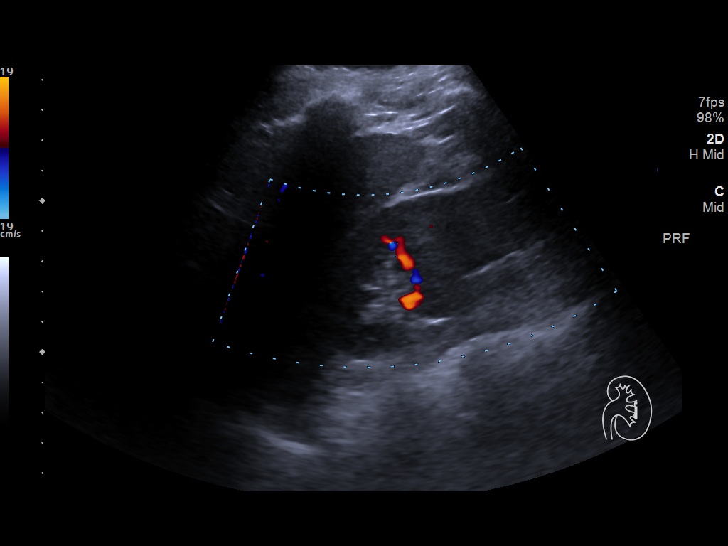
[im 26/41]
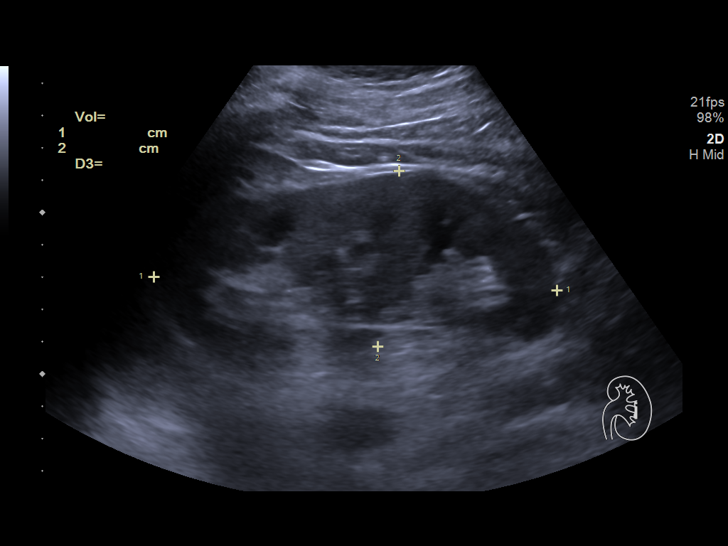
[im 27/41]
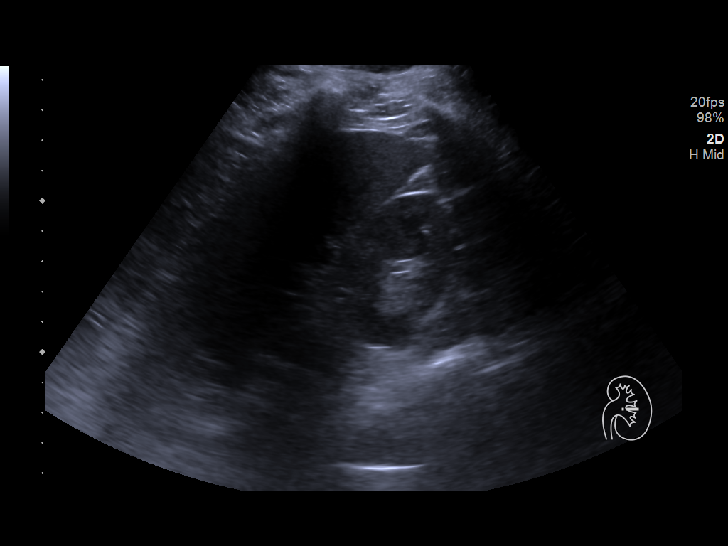
[im 31/41]
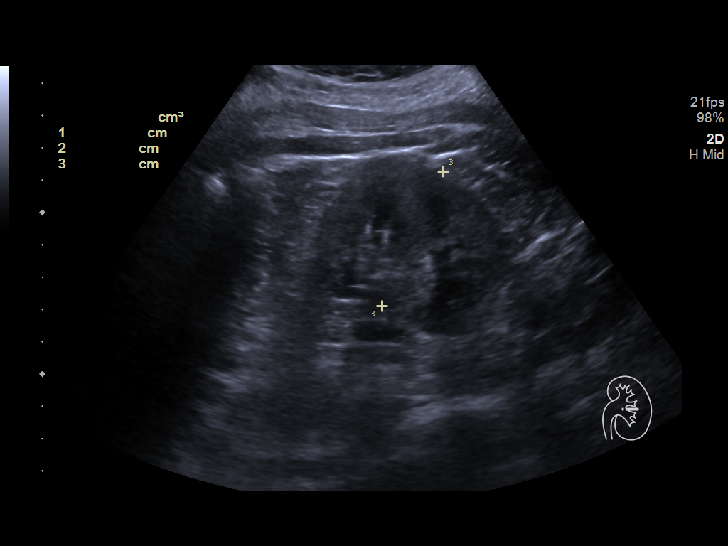
[im 34/41]
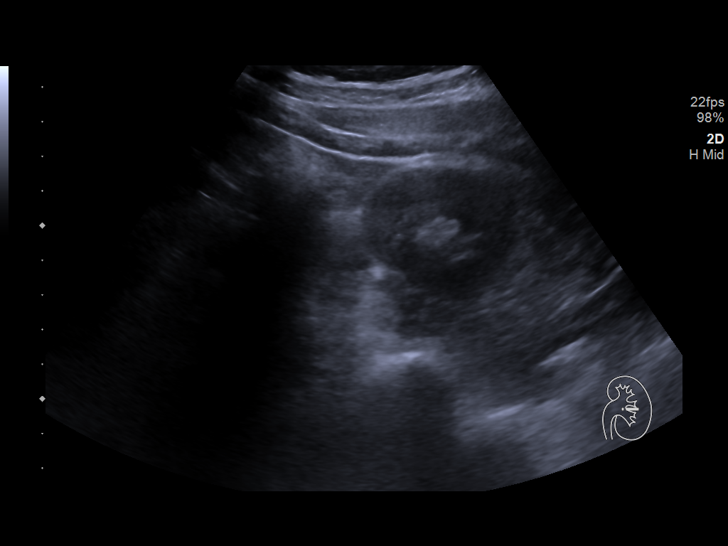
[im 37/41]
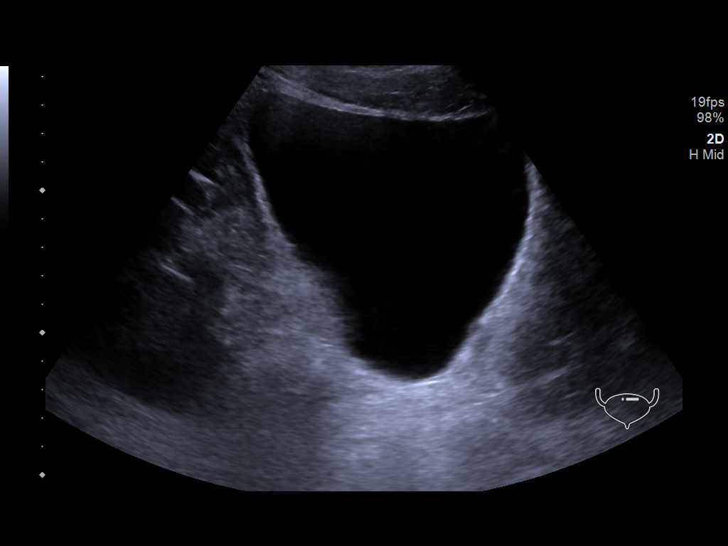
[im 41/41]
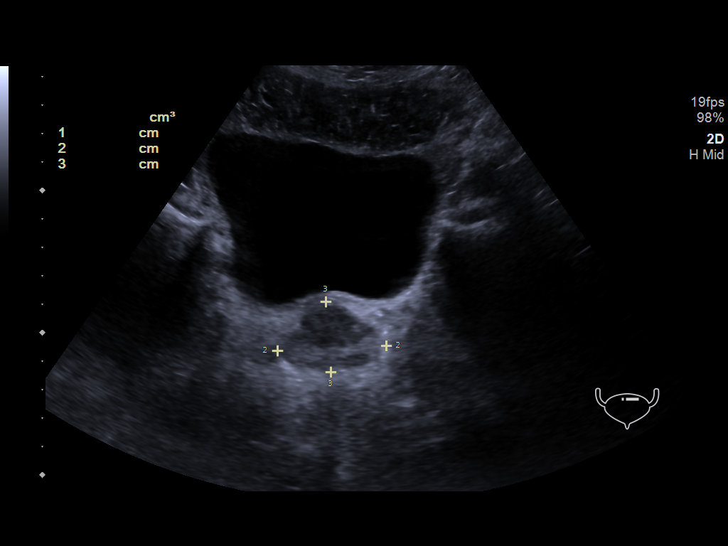

[14 of 25 positions shown; findings below may reference images not displayed]

FINDINGS: Right Kidney:

Renal measurements: 12.1 x 4.2 by 4.9 cm = volume: 130 mL .
Echogenicity within normal limits. No mass or hydronephrosis
visualized.

Left Kidney:

Renal measurements: 12.5 x 5.5 x 4.6 cm = volume: 164 mL.
Echogenicity within normal limits. No mass or hydronephrosis
visualized.

Bladder:

Appears normal for degree of bladder distention.

Other:

No evidence of urinary tract calculi.

Prostate is grossly normal in size.
IMPRESSION: 1. Unremarkable renal ultrasound. No evidence of urinary tract
calculi or obstructive uropathy.

## 2021-08-13 ENCOUNTER — Encounter (HOSPITAL_COMMUNITY): Payer: Self-pay

## 2021-08-13 ENCOUNTER — Ambulatory Visit (HOSPITAL_COMMUNITY)
Admission: RE | Admit: 2021-08-13 | Discharge: 2021-08-13 | Disposition: A | Discharge status: Home / Self Care | Payer: Self-pay | Source: Ambulatory Visit | Attending: Family Medicine | Admitting: Family Medicine

## 2021-08-13 ENCOUNTER — Ambulatory Visit (INDEPENDENT_AMBULATORY_CARE_PROVIDER_SITE_OTHER): Payer: Self-pay

## 2021-08-13 ENCOUNTER — Other Ambulatory Visit: Payer: Self-pay

## 2021-08-13 VITALS — BP 113/78 | HR 75 | Temp 98.1°F | Resp 20

## 2021-08-13 DIAGNOSIS — R0782 Intercostal pain: Secondary | ICD-10-CM

## 2021-08-13 DIAGNOSIS — S20212A Contusion of left front wall of thorax, initial encounter: Secondary | ICD-10-CM

## 2021-08-13 MED ORDER — LIDOCAINE 5 % EX PTCH: 1.0000 | MEDICATED_PATCH | CUTANEOUS | 0 refills | Status: DC

## 2021-08-13 MED ORDER — PREDNISONE 20 MG PO TABS: 40.0000 mg | ORAL_TABLET | Freq: Every day | ORAL | 0 refills | Status: DC

## 2021-08-13 MED ORDER — CYCLOBENZAPRINE HCL 10 MG PO TABS: 10.0000 mg | ORAL_TABLET | Freq: Three times a day (TID) | ORAL | 0 refills | Status: DC | PRN

## 2021-08-13 NOTE — ED Provider Notes (Signed)
[facility]    CSN: [id number] Arrival date & time: 08/13/21  9622      History   Chief Complaint Chief Complaint  Patient presents with   Appointment    1000   Rib cage pain    HPI Chad Martin is a 40 y.o. male.   Patient presenting today with 2-week history of left anterior rib pain.  States he was playing kickball at that time and was tackled.  Pain is worse with deep breathing and movement coughing, sneezing.  No bruising, swelling to the area and no wheezing or chest tightness, shortness of breath.  So far taking over-the-counter pain relievers with mild relief.   Past Medical History:  Diagnosis Date   Kidney stones     There are no problems to display for this patient.   Past Surgical History:  Procedure Laterality Date   ANKLE SURGERY         Home Medications    Prior to Admission medications   Medication Sig Start Date End Date Taking? Authorizing Provider  cyclobenzaprine (FLEXERIL) 10 MG tablet Take 1 tablet (10 mg total) by mouth 3 (three) times daily as needed for muscle spasms. Do not drink alcohol or drive while taking this medication.  May cause drowsiness. 08/13/21  Yes Particia Nearing, PA-C  lidocaine (LIDODERM) 5 % Place 1 patch onto the skin daily. Remove & Discard patch within 12 hours or as directed by MD 08/13/21  Yes Particia Nearing, PA-C  predniSONE (DELTASONE) 20 MG tablet Take 2 tablets (40 mg total) by mouth daily with breakfast. 08/13/21  Yes Particia Nearing, PA-C  albuterol (PROVENTIL HFA;VENTOLIN HFA) 108 (90 BASE) MCG/ACT inhaler Inhale 1-2 puffs into the lungs every 6 (six) hours as needed for wheezing or shortness of breath. 08/13/15   Harris, Abigail, PA-C  HYDROcodone-homatropine [facility]) 5-1.5 MG/5ML syrup Take 5 mLs by mouth every 6 (six) hours as needed for cough. 08/13/15   Arthor Captain, PA-C  ibuprofen (ADVIL,MOTRIN) 200 MG tablet Take 400 mg by mouth every 6 (six) hours as needed for  headache, mild pain or moderate pain.    [provider]  methocarbamol (ROBAXIN) 500 MG tablet Take 1 tablet (500 mg total) by mouth 2 (two) times daily. 12/18/19   Arlyn Dunning, PA-C  Multiple Vitamins-Minerals (MULTIVITAMIN ADULT PO) Take 1 tablet by mouth daily.    [provider]  naproxen (NAPROSYN) 500 MG tablet Take 1 tablet (500 mg total) by mouth 2 (two) times daily. 12/18/19   Arlyn Dunning, PA-C  oseltamivir (TAMIFLU) 75 MG capsule Take 1 capsule (75 mg total) by mouth every 12 (twelve) hours. 08/13/15   Harris, Cammy Copa, PA-C  predniSONE (DELTASONE) 20 MG tablet Take 2 tablets (40 mg total) by mouth daily. 08/13/15   Harris, Abigail, PA-C  Pseudoeph-Doxylamine-DM-APAP (NYQUIL PO) Take 15-30 mLs by mouth at bedtime as needed (cold symptoms).    [provider]    Family History History reviewed. No pertinent family history.  Social History Social History   Tobacco Use   Smoking status: Every Day    Packs/day: 0.50    Types: Cigarettes   Smokeless tobacco: Never  Substance Use Topics   Alcohol use: No   Drug use: Never     Allergies   Patient has no known allergies.   Review of Systems Review of Systems Per HPI  Physical Exam Triage Vital Signs ED Triage Vitals  Enc Vitals Group     BP  08/13/21 1031 113/78     Pulse Rate 08/13/21 1031 75     Resp 08/13/21 1031 20     Temp 08/13/21 1031 98.1 F (36.7 C)     Temp Source 08/13/21 1031 Oral     SpO2 08/13/21 1031 94 %     Weight --      Height --      Head Circumference --      Peak Flow --      Pain Score 08/13/21 1027 9     Pain Loc --      Pain Edu? --      Excl. in GC? --    No data found.  Updated Vital Signs BP 113/78 (BP Location: Right Arm)   Pulse 75   Temp 98.1 F (36.7 C) (Oral)   Resp 20   SpO2 94%   Visual Acuity Right Eye Distance:   Left Eye Distance:   Bilateral Distance:    Right Eye Near:   Left Eye Near:    Bilateral Near:     Physical  Exam Vitals and nursing note reviewed.  Constitutional:      Appearance: Normal appearance.  HENT:     Head: Atraumatic.     Nose: Nose normal.     Mouth/Throat:     Mouth: Mucous membranes are moist.     Pharynx: Oropharynx is clear.  Eyes:     Extraocular Movements: Extraocular movements intact.     Conjunctiva/sclera: Conjunctivae normal.  Cardiovascular:     Rate and Rhythm: Normal rate and regular rhythm.  Pulmonary:     Effort: Pulmonary effort is normal. No respiratory distress.     Breath sounds: Normal breath sounds. No wheezing or rales.     Comments: Chest rise symmetric bilaterally.  Breath sounds full and equal bilaterally.  Speaking full sentences, breathing comfortably at rest. Abdominal:     General: Bowel sounds are normal. There is no distension.     Palpations: Abdomen is soft.     Tenderness: There is no abdominal tenderness. There is no right CVA tenderness, left CVA tenderness or guarding.  Musculoskeletal:        General: Normal range of motion.     Cervical back: Normal range of motion and neck supple.  Skin:    General: Skin is warm and dry.  Neurological:     General: No focal deficit present.     Mental Status: He is oriented to person, place, and time.  Psychiatric:        Mood and Affect: Mood normal.        Thought Content: Thought content normal.        Judgment: Judgment normal.     UC Treatments / Results  Labs (all labs ordered are listed, but only abnormal results are displayed) Labs Reviewed - No data to display  EKG   Radiology DG Ribs Unilateral W/Chest Left  Result Date: 08/13/2021 CLINICAL DATA:  Tackled playing kickball, left rib pain EXAM: LEFT RIBS AND CHEST - 3+ VIEW COMPARISON:  04/19/2014 FINDINGS: No fracture or other bone lesions are seen involving the ribs. There is no evidence of pneumothorax or pleural effusion. Both lungs are clear. Heart size and mediastinal contours are within normal limits. IMPRESSION: No  displaced fracture or other radiographic findings to explain left-sided rib pain. No acute abnormality of the lungs. Electronically Signed   By: Jearld Lesch M.D.   On: 08/13/2021 11:19    Procedures Procedures (including  critical care time)  Medications Ordered in UC Medications - No data to display  Initial Impression / Assessment and Plan / UC Course  I have reviewed the triage vital signs and the nursing notes.  Pertinent labs & imaging results that were available during my care of the patient were reviewed by me and considered in my medical decision making (see chart for details).     Vitals and exam very reassuring today.  Patient requesting x-ray imaging, this was negative for acute bony abnormalities in the area of his pain.  Suspect contusion from the kickball incident.  Heat, lidocaine patches, Flexeril, prednisone in addition to over-the-counter pain relievers.  Return for acutely worsening symptoms.  Final Clinical Impressions(s) / UC Diagnoses   Final diagnoses:  Contusion of rib on left side, initial encounter   Discharge Instructions   None    ED Prescriptions     Medication Sig Dispense Auth. Provider   predniSONE (DELTASONE) 20 MG tablet Take 2 tablets (40 mg total) by mouth daily with breakfast. 6 tablet Particia Nearing, PA-C   cyclobenzaprine (FLEXERIL) 10 MG tablet Take 1 tablet (10 mg total) by mouth 3 (three) times daily as needed for muscle spasms. Do not drink alcohol or drive while taking this medication.  May cause drowsiness. 15 tablet Particia Nearing, PA-C   lidocaine (LIDODERM) 5 % Place 1 patch onto the skin daily. Remove & Discard patch within 12 hours or as directed by MD 30 patch Particia Nearing, PA-C      PDMP not reviewed this encounter.   Particia Nearing, [place] 08/13/21 1321

## 2021-08-13 NOTE — ED Triage Notes (Signed)
Pt reports left side rib cage pain x 2 weeks. [place] started after he played kickball. Pain is worse when taking deep breath, cough, sneezing "feel I will have a heart attack when sneezing".

## 2022-04-16 IMAGING — DX DG RIBS W/ CHEST 3+V*L*
3 series · 3 of 3 positions shown · non-contrast
Comparison: 04/19/2014

CLINICAL DATA: Tackled playing kickball, left rib pain

EXAM:
LEFT RIBS AND CHEST - 3+ VIEW

[chest pa]
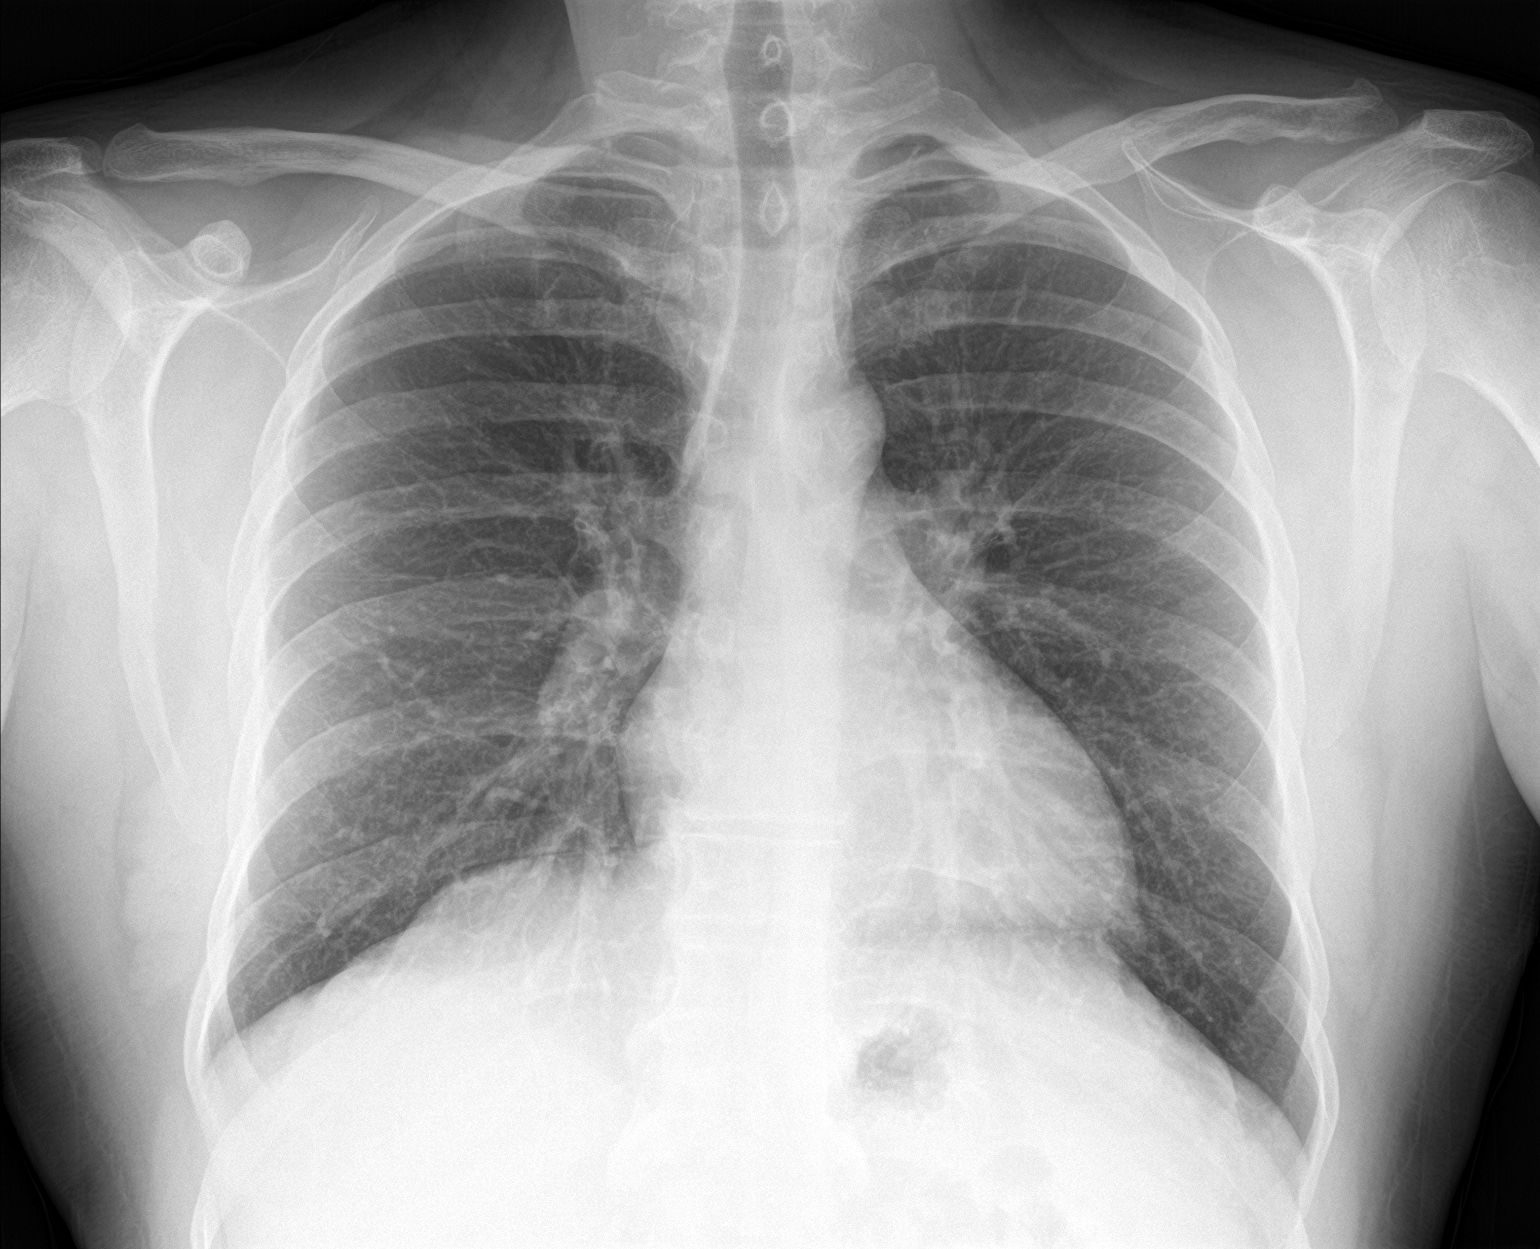

[rib obl]
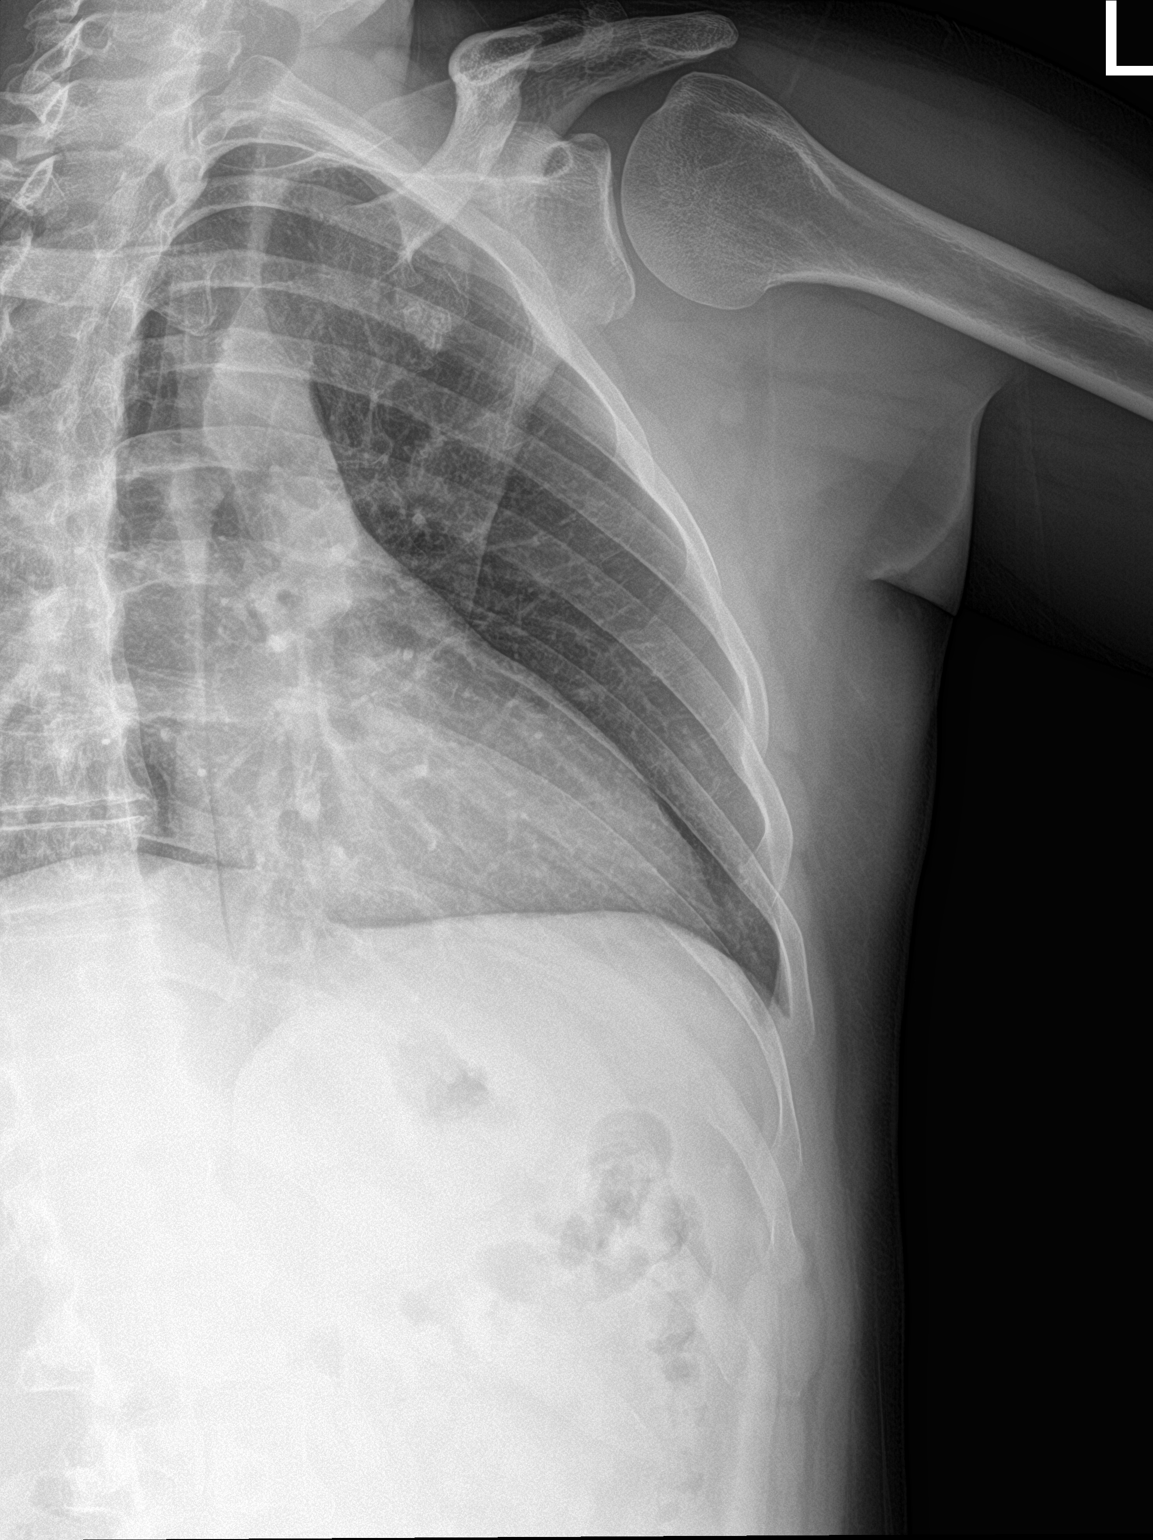

[rib pa]
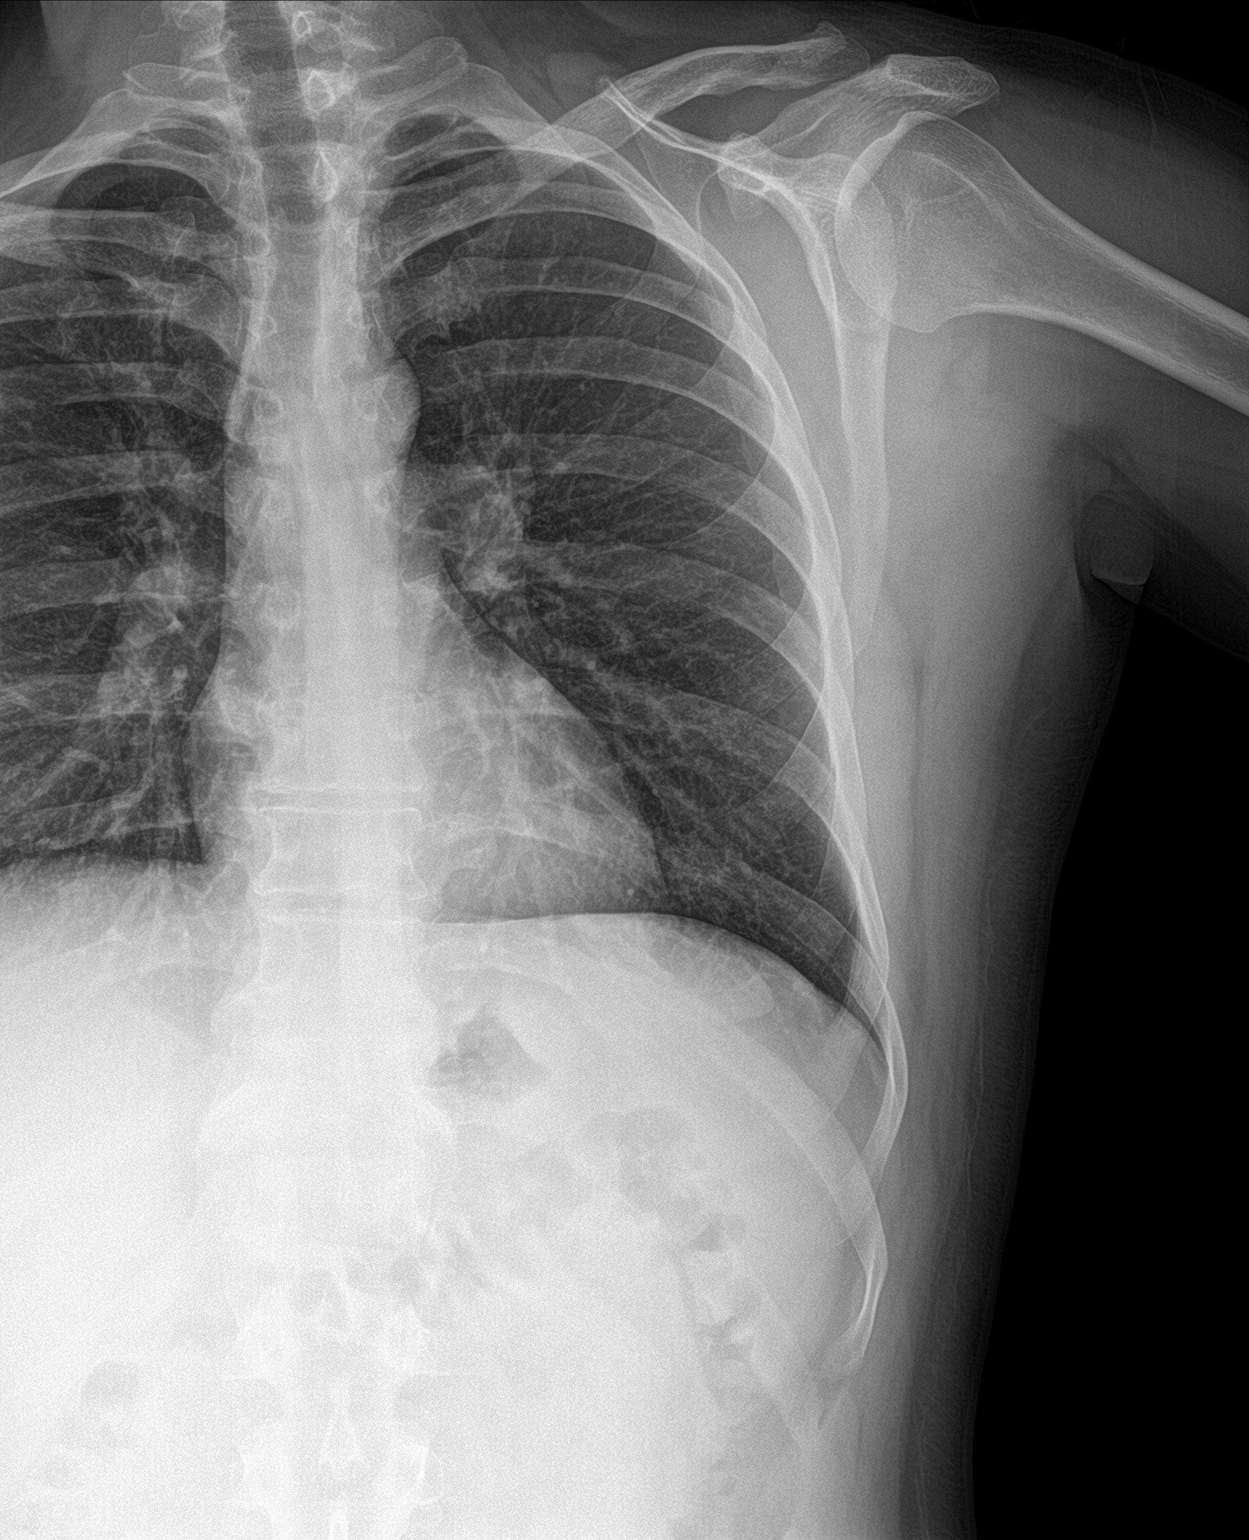

[3 of 3 positions shown; findings below may reference images not displayed]

FINDINGS: No fracture or other bone lesions are seen involving the ribs. There
is no evidence of pneumothorax or pleural effusion. Both lungs are
clear. Heart size and mediastinal contours are within normal limits.
IMPRESSION: No displaced fracture or other radiographic findings to explain
left-sided rib pain. No acute abnormality of the lungs.

## 2022-09-08 ENCOUNTER — Ambulatory Visit (HOSPITAL_COMMUNITY)
Admission: EM | Admit: 2022-09-08 | Discharge: 2022-09-08 | Disposition: A | Discharge status: Home / Self Care | Payer: Self-pay | Attending: Physician Assistant | Admitting: Physician Assistant

## 2022-09-08 ENCOUNTER — Ambulatory Visit (INDEPENDENT_AMBULATORY_CARE_PROVIDER_SITE_OTHER): Payer: Self-pay

## 2022-09-08 ENCOUNTER — Encounter (HOSPITAL_COMMUNITY): Payer: Self-pay

## 2022-09-08 DIAGNOSIS — Z720 Tobacco use: Secondary | ICD-10-CM

## 2022-09-08 DIAGNOSIS — R0602 Shortness of breath: Secondary | ICD-10-CM

## 2022-09-08 DIAGNOSIS — R059 Cough, unspecified: Secondary | ICD-10-CM

## 2022-09-08 DIAGNOSIS — J40 Bronchitis, not specified as acute or chronic: Secondary | ICD-10-CM

## 2022-09-08 DIAGNOSIS — J329 Chronic sinusitis, unspecified: Secondary | ICD-10-CM

## 2022-09-08 MED ORDER — PROMETHAZINE-DM 6.25-15 MG/5ML PO SYRP: 5.0000 mL | ORAL_SOLUTION | Freq: Four times a day (QID) | ORAL | 0 refills | Status: DC | PRN

## 2022-09-08 MED ORDER — CEFDINIR 300 MG PO CAPS: 300.0000 mg | ORAL_CAPSULE | Freq: Two times a day (BID) | ORAL | 0 refills | Status: DC

## 2022-09-08 MED ORDER — ALBUTEROL SULFATE HFA 108 (90 BASE) MCG/ACT IN AERS: 1.0000 | INHALATION_SPRAY | Freq: Four times a day (QID) | RESPIRATORY_TRACT | 0 refills | Status: AC | PRN

## 2022-09-08 MED ORDER — PREDNISONE 10 MG (21) PO TBPK: ORAL_TABLET | ORAL | 0 refills | Status: DC

## 2022-09-08 MED ORDER — ALBUTEROL SULFATE (2.5 MG/3ML) 0.083% IN NEBU
INHALATION_SOLUTION | RESPIRATORY_TRACT | Status: AC
  Filled 2022-09-08: qty 3

## 2022-09-08 MED ORDER — ALBUTEROL SULFATE HFA 108 (90 BASE) MCG/ACT IN AERS
2.0000 | INHALATION_SPRAY | Freq: Once | RESPIRATORY_TRACT | Status: AC
  Administered 2022-09-08: 2 via RESPIRATORY_TRACT

## 2022-09-08 NOTE — ED Triage Notes (Signed)
Pt is here for cough, sob, sore throat , runny nose nasal congestion and bilateral ear pain x1wk

## 2022-09-08 NOTE — Discharge Instructions (Signed)
Your x-ray was normal.  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  We are going to treat with several medications.  Continue your albuterol inhaler every 4-6 hours as needed.  Start cefdinir twice daily for 10 days to cover for infection.  Start prednisone taper.  Do not take NSAIDs with this medication including aspirin, ibuprofen/Advil, naproxen/Aleve.  Use Mucinex, Tylenol, Flonase over-the-counter.  Make sure you rest and drink plenty fluid.  Use Promethazine DM for cough.  This can make you sleepy so do not drive or drink alcohol with taking it.  If your symptoms are not improving or if anything worsens you need to be seen immediately.

## 2022-09-08 NOTE — ED Provider Notes (Signed)
[facility]    CSN: [id number] Arrival date & time: 09/08/22  5784      History   Chief Complaint Chief Complaint  Patient presents with   Sore Throat   Cough   Shortness of Breath   Dizziness    HPI Chad Martin is a 41 y.o. male.   Patient presents today with a week plus long history of URI symptoms.  Reports sore throat, cough, chest tightness, shortness of breath, congestion.  Denies any fever, chest pain.  Has tried over-the-counter medications including DayQuil/NyQuil without improvement of symptoms.  Denies any known sick contacts.  Denies any recent antibiotic or steroid use.  He has had COVID several years ago but not more recently.  Reports that he is a smoker but denies formal diagnosis of asthma or COPD.  He has needed inhaler in the past with illness to help with shortness of breath but does not have one available at this time.    Past Medical History:  Diagnosis Date   Kidney stones     There are no problems to display for this patient.   Past Surgical History:  Procedure Laterality Date   ANKLE SURGERY         Home Medications    Prior to Admission medications   Medication Sig Start Date End Date Taking? Authorizing Provider  cefdinir (OMNICEF) 300 MG capsule Take 1 capsule (300 mg total) by mouth 2 (two) times daily. 09/08/22  Yes Avante Carneiro K, PA-C  predniSONE (STERAPRED UNI-PAK 21 TAB) 10 MG (21) TBPK tablet As directed 09/08/22  Yes Jadah Bobak K, PA-C  promethazine-dextromethorphan (PROMETHAZINE-DM) 6.25-15 MG/5ML syrup Take 5 mLs by mouth 4 (four) times daily as needed for cough. 09/08/22  Yes Jaxon Flatt K, PA-C  albuterol (VENTOLIN HFA) 108 (90 Base) MCG/ACT inhaler Inhale 1-2 puffs into the lungs every 6 (six) hours as needed for wheezing or shortness of breath. 09/08/22   Lukasz Rogus, Noberto Retort, PA-C  Multiple Vitamins-Minerals (MULTIVITAMIN ADULT PO) Take 1 tablet by mouth daily.    [provider]    Family  History History reviewed. No pertinent family history.  Social History Social History   Tobacco Use   Smoking status: Every Day    Packs/day: 0.50    Types: Cigarettes   Smokeless tobacco: Never  Substance Use Topics   Alcohol use: No   Drug use: Never     Allergies   Patient has no known allergies.   Review of Systems Review of Systems  Constitutional:  Positive for activity change. Negative for appetite change, fatigue and fever.  HENT:  Positive for congestion and sore throat. Negative for sinus pressure and sneezing.   Respiratory:  Positive for cough, chest tightness, shortness of breath and wheezing.   Cardiovascular:  Negative for chest pain.  Gastrointestinal:  Negative for abdominal pain, diarrhea, nausea and vomiting.  Neurological:  Positive for dizziness and headaches. Negative for light-headedness.     Physical Exam Triage Vital Signs ED Triage Vitals  Enc Vitals Group     BP 09/08/22 0831 109/78     Pulse Rate 09/08/22 0831 75     Resp 09/08/22 0831 18     Temp 09/08/22 0831 97.7 F (36.5 C)     Temp Source 09/08/22 0831 Oral     SpO2 09/08/22 0831 95 %     Weight --      Height --      Head Circumference --  Peak Flow --      Pain Score 09/08/22 0828 9     Pain Loc --      Pain Edu? --      Excl. in [place]? --    No data found.  Updated Vital Signs BP 109/78 (BP Location: Left Arm)   Pulse 75   Temp 97.7 F (36.5 C) (Oral)   Resp 18   SpO2 95%   Visual Acuity Right Eye Distance:   Left Eye Distance:   Bilateral Distance:    Right Eye Near:   Left Eye Near:    Bilateral Near:     Physical Exam Vitals reviewed.  Constitutional:      General: He is awake.     Appearance: Normal appearance. He is well-developed. He is not ill-appearing.     Comments: Very pleasant male appears stated age in no acute distress sitting comfortably in exam room  HENT:     Head: Normocephalic and atraumatic.     Right Ear: Tympanic membrane, ear  canal and external ear normal. Tympanic membrane is not erythematous or bulging.     Left Ear: Tympanic membrane, ear canal and external ear normal. Tympanic membrane is not erythematous or bulging.     Nose:     Right Sinus: Maxillary sinus tenderness present. No frontal sinus tenderness.     Left Sinus: Maxillary sinus tenderness present. No frontal sinus tenderness.     Mouth/Throat:     Pharynx: Uvula midline. No oropharyngeal exudate or posterior oropharyngeal erythema.  Cardiovascular:     Rate and Rhythm: Normal rate and regular rhythm.     Heart sounds: Normal heart sounds, S1 normal and S2 normal. No murmur heard. Pulmonary:     Effort: Pulmonary effort is normal. No accessory muscle usage or respiratory distress.     Breath sounds: No stridor. Wheezing present. No rhonchi or rales.     Comments: Scattered wheezing.  Reactive cough with deep breathing. Abdominal:     General: Bowel sounds are normal.     Palpations: Abdomen is soft.     Tenderness: There is no abdominal tenderness.  Neurological:     Mental Status: He is alert.  Psychiatric:        Behavior: Behavior is cooperative.      UC Treatments / Results  Labs (all labs ordered are listed, but only abnormal results are displayed) Labs Reviewed - No data to display  EKG   Radiology DG Chest 2 View  Result Date: 09/08/2022 CLINICAL DATA:  Worsening cough, shortness of breath EXAM: CHEST - 2 VIEW COMPARISON:  08/13/2021 FINDINGS: The heart size and mediastinal contours are within normal limits. Both lungs are clear. The visualized skeletal structures are unremarkable. IMPRESSION: No active cardiopulmonary disease. Electronically Signed   By: Elige Ko M.D.   On: 09/08/2022 09:05    Procedures Procedures (including critical care time)  Medications Ordered in UC Medications  albuterol (VENTOLIN HFA) 108 (90 Base) MCG/ACT inhaler 2 puff (2 puffs Inhalation Given 09/08/22 0950)    Initial Impression /  Assessment and Plan / UC Course  I have reviewed the triage vital signs and the nursing notes.  Pertinent labs & imaging results that were available during my care of the patient were reviewed by me and considered in my medical decision making (see chart for details).     Patient is well-appearing, afebrile, nontoxic, nontachycardic.  No indication for viral testing as he has been symptomatic for over a week  and this would not change management.  Given prolonged and worsening symptoms concern for secondary infection.  Chest x-ray was obtained that was normal.  Patient was given albuterol in clinic with improvement but not resolution of symptoms.  He was sent home with this medication with instruction to use this every 4-6 hours as needed.  He was started on Omnicef to cover for sinus/bronchitis as well as COPD exacerbation.  Started on prednisone taper with instruction to take NSAIDs with this medication due to risk of GI bleeding.  Can use Tylenol, Mucinex, Flonase for symptom relief.  Was prescribed Promethazine DM for cough.  Discussed this can be sedating and he should not drive or drink alcohol with taking medication.  He is to rest and drink plenty of fluid.  Discussed that if symptoms are proving within a week he should return for reevaluation.  If he has any worsening symptoms he needs to be seen immediately.  Strict return precautions given.  Work excuse note provided.  Final Clinical Impressions(s) / UC Diagnoses   Final diagnoses:  Sinobronchitis  Tobacco abuse     Discharge Instructions      Your x-ray was normal.  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  We are going to treat with several medications.  Continue your albuterol inhaler every 4-6 hours as needed.  Start cefdinir twice daily for 10 days to cover for infection.  Start prednisone taper.  Do not take NSAIDs with this medication including aspirin,  ibuprofen/Advil, naproxen/Aleve.  Use Mucinex, Tylenol, Flonase over-the-counter.  Make sure you rest and drink plenty fluid.  Use Promethazine DM for cough.  This can make you sleepy so do not drive or drink alcohol with taking it.  If your symptoms are not improving or if anything worsens you need to be seen immediately.     ED Prescriptions     Medication Sig Dispense Auth. Provider   albuterol (VENTOLIN HFA) 108 (90 Base) MCG/ACT inhaler Inhale 1-2 puffs into the lungs every 6 (six) hours as needed for wheezing or shortness of breath. 1 each Juaquina Machnik K, PA-C   cefdinir (OMNICEF) 300 MG capsule Take 1 capsule (300 mg total) by mouth 2 (two) times daily. 20 capsule Amr Sturtevant K, PA-C   predniSONE (STERAPRED UNI-PAK 21 TAB) 10 MG (21) TBPK tablet As directed 21 tablet Dava Rensch K, PA-C   promethazine-dextromethorphan (PROMETHAZINE-DM) 6.25-15 MG/5ML syrup Take 5 mLs by mouth 4 (four) times daily as needed for cough. 118 mL Uriah Philipson K, PA-C      PDMP not reviewed this encounter.   Terrilee Croak, PA-C 09/08/22 1009

## 2022-09-10 ENCOUNTER — Telehealth (HOSPITAL_COMMUNITY): Payer: Self-pay | Admitting: Emergency Medicine

## 2022-09-10 MED ORDER — PROMETHAZINE-DM 6.25-15 MG/5ML PO SYRP: 5.0000 mL | ORAL_SOLUTION | Freq: Four times a day (QID) | ORAL | 0 refills | Status: DC | PRN

## 2022-09-10 NOTE — Telephone Encounter (Signed)
Patient called and states he spilled his cough syrup and would like a refill.  Reviewed with Junie Panning, APP who okay'd refill.  Reviewed with patient, verified pharmacy, prescription sent

## 2022-12-01 ENCOUNTER — Telehealth (HOSPITAL_COMMUNITY): Payer: Self-pay | Admitting: Emergency Medicine

## 2022-12-01 ENCOUNTER — Ambulatory Visit (HOSPITAL_COMMUNITY)
Admission: EM | Admit: 2022-12-01 | Discharge: 2022-12-01 | Disposition: A | Discharge status: Home / Self Care | Payer: Self-pay | Attending: Family Medicine | Admitting: Family Medicine

## 2022-12-01 ENCOUNTER — Encounter (HOSPITAL_COMMUNITY): Payer: Self-pay | Admitting: Emergency Medicine

## 2022-12-01 DIAGNOSIS — J4521 Mild intermittent asthma with (acute) exacerbation: Secondary | ICD-10-CM

## 2022-12-01 DIAGNOSIS — R0789 Other chest pain: Secondary | ICD-10-CM

## 2022-12-01 DIAGNOSIS — J069 Acute upper respiratory infection, unspecified: Secondary | ICD-10-CM

## 2022-12-01 MED ORDER — KETOROLAC TROMETHAMINE 30 MG/ML IJ SOLN
INTRAMUSCULAR | Status: AC
  Filled 2022-12-01: qty 1

## 2022-12-01 MED ORDER — PROMETHAZINE-DM 6.25-15 MG/5ML PO SYRP: 5.0000 mL | ORAL_SOLUTION | Freq: Four times a day (QID) | ORAL | 0 refills | Status: DC | PRN

## 2022-12-01 MED ORDER — PREDNISONE 10 MG (21) PO TBPK: ORAL_TABLET | ORAL | 0 refills | Status: AC

## 2022-12-01 MED ORDER — CEFDINIR 300 MG PO CAPS: 300.0000 mg | ORAL_CAPSULE | Freq: Two times a day (BID) | ORAL | 0 refills | Status: AC

## 2022-12-01 MED ORDER — PROMETHAZINE-DM 6.25-15 MG/5ML PO SYRP: 5.0000 mL | ORAL_SOLUTION | Freq: Four times a day (QID) | ORAL | 0 refills | Status: AC | PRN

## 2022-12-01 MED ORDER — PREDNISONE 10 MG (21) PO TBPK: ORAL_TABLET | ORAL | 0 refills | Status: DC

## 2022-12-01 MED ORDER — CEFDINIR 300 MG PO CAPS: 300.0000 mg | ORAL_CAPSULE | Freq: Two times a day (BID) | ORAL | 0 refills | Status: DC

## 2022-12-01 MED ORDER — KETOROLAC TROMETHAMINE 30 MG/ML IJ SOLN
30.0000 mg | Freq: Once | INTRAMUSCULAR | Status: AC
  Administered 2022-12-01: 30 mg via INTRAMUSCULAR

## 2022-12-01 NOTE — ED Provider Notes (Signed)
[facility]    CSN: [id number] Arrival date & time: 12/01/22  1537      History   Chief Complaint Chief Complaint  Patient presents with   Sore Throat    HPI Chad Martin is a 42 y.o. male.    Sore Throat   Here for sore throat and a slight cough.  He has had a good bit of malaise and has had some pleuritic pain in his anterior chest.  Symptoms began on January 11.  He has not noted any fever at home but he has had some chills.  Warm soup help the sore throat.  He has felt short of breath some and states his inhaler has not helped much.  There has been some question of whether or not he has asthma or COPD.  He will be having lung testing done in another few weeks.  When he was provided a prescription for prednisone and antibiotics for similar illness in October he improved.  Also the cough medication helped  Past Medical History:  Diagnosis Date   Kidney stones     There are no problems to display for this patient.   Past Surgical History:  Procedure Laterality Date   ANKLE SURGERY         Home Medications    Prior to Admission medications   Medication Sig Start Date End Date Taking? Authorizing Provider  albuterol (VENTOLIN HFA) 108 (90 Base) MCG/ACT inhaler Inhale 1-2 puffs into the lungs every 6 (six) hours as needed for wheezing or shortness of breath. 09/08/22   Raspet, Derry Skill, PA-C  cefdinir (OMNICEF) 300 MG capsule Take 1 capsule (300 mg total) by mouth 2 (two) times daily for 10 days. 12/01/22 12/11/22  Barrett Henle, MD  Multiple Vitamins-Minerals (MULTIVITAMIN ADULT PO) Take 1 tablet by mouth daily.    [provider]  predniSONE (STERAPRED UNI-PAK 21 TAB) 10 MG (21) TBPK tablet As directed 12/01/22 12/08/22  Barrett Henle, MD  promethazine-dextromethorphan (PROMETHAZINE-DM) 6.25-15 MG/5ML syrup Take 5 mLs by mouth 4 (four) times daily as needed for up to 7 days for cough. 12/01/22 12/08/22  Barrett Henle, MD     Family History No family history on file.  Social History Social History   Tobacco Use   Smoking status: Every Day    Packs/day: 0.50    Types: Cigarettes   Smokeless tobacco: Never  Substance Use Topics   Alcohol use: No   Drug use: Never     Allergies   Patient has no known allergies.   Review of Systems Review of Systems   Physical Exam Triage Vital Signs ED Triage Vitals  Enc Vitals Group     BP 12/01/22 1553 (!) 122/91     Pulse Rate 12/01/22 1553 97     Resp 12/01/22 1553 18     Temp 12/01/22 1553 98.6 F (37 C)     Temp Source 12/01/22 1553 Oral     SpO2 12/01/22 1553 98 %     Weight --      Height --      Head Circumference --      Peak Flow --      Pain Score 12/01/22 1551 8     Pain Loc --      Pain Edu? --      Excl. in [place]? --    No data found.  Updated Vital Signs BP (!) 122/91 (BP Location: Left Arm)   Pulse 97  Temp 98.6 F (37 C) (Oral)   Resp 18   SpO2 98%   Visual Acuity Right Eye Distance:   Left Eye Distance:   Bilateral Distance:    Right Eye Near:   Left Eye Near:    Bilateral Near:     Physical Exam Vitals reviewed.  Constitutional:      General: He is not in acute distress.    Appearance: He is not ill-appearing, toxic-appearing or diaphoretic.  HENT:     Right Ear: Tympanic membrane and ear canal normal.     Left Ear: Tympanic membrane and ear canal normal.     Nose: Nose normal.     Mouth/Throat:     Mouth: Mucous membranes are moist.     Comments: Erythema of the soft palate and of the posterior oropharynx. Eyes:     Extraocular Movements: Extraocular movements intact.     Conjunctiva/sclera: Conjunctivae normal.     Pupils: Pupils are equal, round, and reactive to light.  Cardiovascular:     Rate and Rhythm: Normal rate and regular rhythm.     Heart sounds: No murmur heard. Pulmonary:     Effort: No respiratory distress.     Breath sounds: No stridor. No wheezing, rhonchi or rales.     Comments:  There is no wheezing or rhonchi on exam.  Breath sounds are diminished Chest:     Chest wall: Tenderness present.  Musculoskeletal:     Cervical back: Neck supple.  Lymphadenopathy:     Cervical: No cervical adenopathy.  Skin:    Capillary Refill: Capillary refill takes less than 2 seconds.     Coloration: Skin is not jaundiced or pale.  Neurological:     General: No focal deficit present.     Mental Status: He is alert and oriented to person, place, and time.  Psychiatric:        Behavior: Behavior normal.      UC Treatments / Results  Labs (all labs ordered are listed, but only abnormal results are displayed) Labs Reviewed - No data to display  EKG   Radiology No results found.  Procedures Procedures (including critical care time)  Medications Ordered in UC Medications  ketorolac (TORADOL) 30 MG/ML injection 30 mg (has no administration in time range)    Initial Impression / Assessment and Plan / UC Course  I have reviewed the triage vital signs and the nursing notes.  Pertinent labs & imaging results that were available during my care of the patient were reviewed by me and considered in my medical decision making (see chart for details).     I am going to treat for sinusitis with the length of his symptoms and also for possible asthma exacerbation with his shortness of breath.  I did discuss with him this most likely began as a viral illness, and if he ever feels like he has flu or COVID, to come in sooner than 7 days of symptoms encouragement given to follow-up with his doctor for the lung testing.  Also he is working on quitting smoking Final Clinical Impressions(s) / UC Diagnoses   Final diagnoses:  Viral upper respiratory tract infection  Mild intermittent asthma with acute exacerbation     Discharge Instructions      You have been given a shot of Toradol 30 mg today.  Take cefdinir 300 mg--2 capsules together daily for 7 days  Take Phenergan with  dextromethorphan syrup--5 mL or 1 teaspoon every 6 hours as needed for  cough  Take the prednisone pack as directed on the package     ED Prescriptions     Medication Sig Dispense Auth. Provider   cefdinir (OMNICEF) 300 MG capsule Take 1 capsule (300 mg total) by mouth 2 (two) times daily for 10 days. 20 capsule Zenia Resides, MD   predniSONE (STERAPRED UNI-PAK 21 TAB) 10 MG (21) TBPK tablet As directed 21 tablet Rikita Grabert, Janace Aris, MD   promethazine-dextromethorphan (PROMETHAZINE-DM) 6.25-15 MG/5ML syrup Take 5 mLs by mouth 4 (four) times daily as needed for up to 7 days for cough. 118 mL Zenia Resides, MD      PDMP not reviewed this encounter.   Zenia Resides, MD 12/01/22 [id number]

## 2022-12-01 NOTE — ED Triage Notes (Addendum)
Pt c/o sore throat and cough for a week.  Pt reports headache and diarrhea started today. Pt feeling fatigue today. Took Nyquil.

## 2022-12-01 NOTE — Discharge Instructions (Addendum)
You have been given a shot of Toradol 30 mg today.  Take cefdinir 300 mg--2 capsules together daily for 7 days  Take Phenergan with dextromethorphan syrup--5 mL or 1 teaspoon every 6 hours as needed for cough  Take the prednisone pack as directed on the package

## 2023-05-12 ENCOUNTER — Ambulatory Visit (INDEPENDENT_AMBULATORY_CARE_PROVIDER_SITE_OTHER): Payer: Self-pay | Admitting: Pulmonary Disease

## 2023-05-12 ENCOUNTER — Encounter: Payer: Self-pay | Admitting: Pulmonary Disease

## 2023-05-12 VITALS — BP 110/68 | HR 82 | Temp 97.8°F | Ht 70.0 in | Wt 208.0 lb

## 2023-05-12 DIAGNOSIS — R0609 Other forms of dyspnea: Secondary | ICD-10-CM

## 2023-05-12 DIAGNOSIS — Z720 Tobacco use: Secondary | ICD-10-CM

## 2023-05-12 MED ORDER — VARENICLINE TARTRATE (STARTER) 0.5 MG X 11 & 1 MG X 42 PO TBPK: ORAL_TABLET | ORAL | 0 refills | Status: AC

## 2023-05-12 NOTE — Patient Instructions (Addendum)
Nice to meet you  To evaluate presence of COPD and your symptoms of shortness of breath I recommend pulmonary function test  Take Chantix as prescribed  0.5 mg once a day for 3 days, then 0.5 mg twice a day for 4 days, then 1 mg twice a day thereafter  Feel free to send a request for refill and we will continue the 1 mg dose twice a day if you need more  Start cutting back on your smoking after you take the medication for a week.  Your MyChart is active, you just need to find your login etc.  Return to clinic in 6 weeks after pulmonary function test with Dr. Judeth Horn  Thank you for enrolling in MyChart. Please follow the instructions below to securely access your online medical record. MyChart allows you to send messages to your doctor, view your test results, renew your prescriptions, schedule appointments, and more.

## 2023-05-12 NOTE — Progress Notes (Signed)
@Patient  ID: Chad Martin, male    DOB: 11/16/1980, 42 y.o.   MRN: [id number]  Chief Complaint  Patient presents with   Consult    SOB with exertion.  Wants help with smoking cessation.     Referring provider: No ref. provider found  HPI:   42 y.o. man whom we are seeing for evaluation of dyspnea on exertion.  He had 12/04/2022 and 09/03/2022 reviewed.  Patient has dyspnea on exertion for some time now.  Worse on inclines or stairs.  Or when carrying things.  His peers or colleagues do not get as winded that he does.  Has about the rest more frequently.  Been going on for several months to years now.  Told he has "COPD."  And wants to know.  Never had spirometry or other PFTs.  Does not take any medications.  No time of day when things are better or worse.  No position to make his better or worse.  No seasonal environmental factors he can identify that things better or worse.  No other alleviating or exacerbating factors.  Most recent chest x-ray 08/2019 reviewed, interpreted as clear lungs with hyperinflation.  PMH: Tobacco abuse Surgical history: Reviewed, he denies any Family history: Father with asthma Social history: Lives in [place], current smoker pack per day for 23 years   Questionaires / Pulmonary Flowsheets:   ACT:      No data to display          MMRC:     No data to display          [place]:      No data to display          Tests:   FENO:  No results found for: "NITRICOXIDE"  PFT:     No data to display          WALK:      No data to display          Imaging: No results found.  Lab Results:  CBC    Component Value Date/Time   WBC 7.5 12/18/2019 1316   RBC 5.64 12/18/2019 1316   HGB 17.3 (H) 12/18/2019 1316   HCT 50.4 12/18/2019 1316   PLT 274 12/18/2019 1316   MCV 89.4 12/18/2019 1316   MCH 30.7 12/18/2019 1316   MCHC 34.3 12/18/2019 1316   RDW 12.8 12/18/2019 1316   LYMPHSABS 2.8 12/18/2019 1316   MONOABS 0.4  12/18/2019 1316   EOSABS 0.3 12/18/2019 1316   BASOSABS 0.1 12/18/2019 1316    BMET    Component Value Date/Time   NA 141 12/18/2019 1316   K 4.3 12/18/2019 1316   CL 103 12/18/2019 1316   CO2 26 12/18/2019 1316   GLUCOSE 96 12/18/2019 1316   BUN 11 12/18/2019 1316   CREATININE 1.03 12/18/2019 1316   CALCIUM 9.3 12/18/2019 1316   GFRNONAA >60 12/18/2019 1316   GFRAA >60 12/18/2019 1316    BNP No results found for: "BNP"  ProBNP No results found for: "PROBNP"  Specialty Problems   None   No Known Allergies   There is no immunization history on file for this patient.  Past Medical History:  Diagnosis Date   Kidney stones     Tobacco History: Social History   Tobacco Use  Smoking Status Every Day   Packs/day: 1.00   Years: 23.00   Additional pack years: 0.00   Total pack years: 23.00   Types: Cigarettes  Smokeless Tobacco Never  Ready to quit: Not Answered Counseling given: Not Answered   Continue to not smoke  Outpatient Encounter Medications as of 05/12/2023  Medication Sig   Varenicline Tartrate, Starter, (CHANTIX STARTING MONTH PAK) 0.5 MG X 11 & 1 MG X 42 TBPK Take one 0.5 mg tablet daily for 3 days, then one 0.5 mg tab twice a day for 4 days, then 1 mg tablet twice a day thereafter   albuterol (VENTOLIN HFA) 108 (90 Base) MCG/ACT inhaler Inhale 1-2 puffs into the lungs every 6 (six) hours as needed for wheezing or shortness of breath. (Patient not taking: Reported on 05/12/2023)   Multiple Vitamins-Minerals (MULTIVITAMIN ADULT PO) Take 1 tablet by mouth daily. (Patient not taking: Reported on 05/12/2023)   No facility-administered encounter medications on file as of 05/12/2023.     Review of Systems  Review of Systems  No chest pain exertion.  No orthopnea or PND.  Comprehensive review of systems otherwise negative. Physical Exam  BP 110/68 (BP Location: Left Arm, Patient Position: Sitting, Cuff Size: Normal)   Pulse 82   Temp 97.8 F (36.6  C) (Oral)   Ht 5\' 10"  (1.778 m)   Wt 208 lb (94.3 kg)   SpO2 96%   BMI 29.84 kg/m   Wt Readings from Last 5 Encounters:  05/12/23 208 lb (94.3 kg)  12/18/19 170 lb (77.1 kg)    BMI Readings from Last 5 Encounters:  05/12/23 29.84 kg/m     Physical Exam General: Sitting in chair, no acute distress Eyes: EOMI, no icterus Neck: Supple, no JVP Cardiovascular: Warm, no edema Pulmonary: Distant, clear Abdomen: Nondistended, bowel sounds present MSK: No synovitis, no joint effusion Neuro: Normal gait, no weakness Psych: Normal mood, full affect   Assessment & Plan:   Dyspnea on exertion: Significant smoking history.  Smoking-related lung disease possible.  PFTs for further evaluation.  Chest x-ray is hyperinflated which further increases suspicion for obstructive pathology.  Tobacco abuse: Chantix prescribed today see below for counseling. Smoking assessment and cessation counseling I have advised the patient to quit/stop smoking as soon as possible due to high risk for multiple medical problems.  It will also be very difficult for [place] to manage patient's  respiratory symptoms and status if we continue to expose her lungs to a known irritant.  Patient is or is not willing to quit smoking. I have advised the patient that we can assist and have options of nicotine replacement therapy, provided smoking cessation education today, provided smoking cessation counseling, and provided cessation resources. Follow-up next office visit office visit for assessment of smoking cessation.  I spent 5 minutes in smoking cessation counseling.    Return in about 6 weeks (around 06/23/2023) for f/u Dr. Judeth Horn, after PFT.   Karren Burly, MD 05/12/2023

## 2023-08-05 ENCOUNTER — Ambulatory Visit: Payer: Self-pay | Admitting: Pulmonary Disease

## 2023-09-30 ENCOUNTER — Encounter: Payer: Self-pay | Admitting: Pulmonary Disease

## 2023-09-30 ENCOUNTER — Ambulatory Visit: Payer: Self-pay | Admitting: Pulmonary Disease

## 2024-07-23 ENCOUNTER — Telehealth: Payer: Self-pay

## 2024-07-23 NOTE — Telephone Encounter (Signed)
 Patient called and was transferred by the front office and advised he needed to be seen by a urologist. I made him aware out office requires a referral and he advised he was seen at the [facility] ED in [place]. I looked in care everywhere and could not find any notes from this year. I advised him to contact the hospital and ask to speak with medical records. Request his ED notes to be faxed to [phone]. Once we receive them I will contact him to schedule an appt. He advised he was seen for a UTI.
